# Patient Record
Sex: Female | Born: 1940
Health system: Southern US, Community
[De-identification: ages and names within clinical notes are randomized; demographics above are authoritative.]

## PROBLEM LIST (undated history)

## (undated) DIAGNOSIS — H269 Unspecified cataract: Secondary | ICD-10-CM

## (undated) DIAGNOSIS — J189 Pneumonia, unspecified organism: Secondary | ICD-10-CM

## (undated) DIAGNOSIS — M1990 Unspecified osteoarthritis, unspecified site: Secondary | ICD-10-CM

## (undated) DIAGNOSIS — T7840XA Allergy, unspecified, initial encounter: Secondary | ICD-10-CM

## (undated) DIAGNOSIS — M8580 Other specified disorders of bone density and structure, unspecified site: Secondary | ICD-10-CM

## (undated) DIAGNOSIS — C801 Malignant (primary) neoplasm, unspecified: Secondary | ICD-10-CM

## (undated) DIAGNOSIS — I82409 Acute embolism and thrombosis of unspecified deep veins of unspecified lower extremity: Secondary | ICD-10-CM

## (undated) DIAGNOSIS — Z9889 Other specified postprocedural states: Secondary | ICD-10-CM

## (undated) DIAGNOSIS — J45909 Unspecified asthma, uncomplicated: Secondary | ICD-10-CM

## (undated) DIAGNOSIS — R112 Nausea with vomiting, unspecified: Secondary | ICD-10-CM

## (undated) HISTORY — PX: CATARACT EXTRACTION, BILATERAL: SHX1313

## (undated) HISTORY — DX: Allergy, unspecified, initial encounter: T78.40XA

## (undated) HISTORY — PX: EYE SURGERY: SHX253

## (undated) HISTORY — DX: Unspecified osteoarthritis, unspecified site: M19.90

## (undated) HISTORY — PX: CHOLECYSTECTOMY: SHX55

## (undated) HISTORY — DX: Unspecified cataract: H26.9

## (undated) HISTORY — PX: OTHER SURGICAL HISTORY: SHX169

## (undated) HISTORY — DX: Other specified disorders of bone density and structure, unspecified site: M85.80

## (undated) HISTORY — PX: ABDOMINAL HYSTERECTOMY: SHX81

---

## 2000-12-07 ENCOUNTER — Other Ambulatory Visit: Admission: RE | Admit: 2000-12-07 | Discharge: 2000-12-07 | Payer: Self-pay | Admitting: Obstetrics and Gynecology

## 2001-12-08 ENCOUNTER — Other Ambulatory Visit: Admission: RE | Admit: 2001-12-08 | Discharge: 2001-12-08 | Payer: Self-pay | Admitting: Obstetrics and Gynecology

## 2002-12-22 HISTORY — PX: COLONOSCOPY: SHX174

## 2003-04-12 ENCOUNTER — Other Ambulatory Visit: Admission: RE | Admit: 2003-04-12 | Discharge: 2003-04-12 | Payer: Self-pay | Admitting: Obstetrics and Gynecology

## 2005-09-01 ENCOUNTER — Ambulatory Visit (HOSPITAL_COMMUNITY): Admission: RE | Admit: 2005-09-01 | Discharge: 2005-09-01 | Payer: Self-pay | Admitting: Specialist

## 2005-09-01 ENCOUNTER — Ambulatory Visit (HOSPITAL_BASED_OUTPATIENT_CLINIC_OR_DEPARTMENT_OTHER): Admission: RE | Admit: 2005-09-01 | Discharge: 2005-09-01 | Payer: Self-pay | Admitting: Specialist

## 2006-10-07 ENCOUNTER — Other Ambulatory Visit: Admission: RE | Admit: 2006-10-07 | Discharge: 2006-10-07 | Payer: Self-pay | Admitting: Obstetrics and Gynecology

## 2012-01-08 DIAGNOSIS — Z1231 Encounter for screening mammogram for malignant neoplasm of breast: Secondary | ICD-10-CM | POA: Diagnosis not present

## 2012-02-26 DIAGNOSIS — J069 Acute upper respiratory infection, unspecified: Secondary | ICD-10-CM | POA: Diagnosis not present

## 2012-04-12 DIAGNOSIS — Z01419 Encounter for gynecological examination (general) (routine) without abnormal findings: Secondary | ICD-10-CM | POA: Diagnosis not present

## 2012-04-12 DIAGNOSIS — Z124 Encounter for screening for malignant neoplasm of cervix: Secondary | ICD-10-CM | POA: Diagnosis not present

## 2012-04-12 DIAGNOSIS — Z0000 Encounter for general adult medical examination without abnormal findings: Secondary | ICD-10-CM | POA: Diagnosis not present

## 2012-04-13 DIAGNOSIS — Z961 Presence of intraocular lens: Secondary | ICD-10-CM | POA: Diagnosis not present

## 2012-04-13 DIAGNOSIS — H2510 Age-related nuclear cataract, unspecified eye: Secondary | ICD-10-CM | POA: Diagnosis not present

## 2012-04-26 DIAGNOSIS — M899 Disorder of bone, unspecified: Secondary | ICD-10-CM | POA: Diagnosis not present

## 2012-04-26 DIAGNOSIS — M949 Disorder of cartilage, unspecified: Secondary | ICD-10-CM | POA: Diagnosis not present

## 2012-05-03 DIAGNOSIS — N959 Unspecified menopausal and perimenopausal disorder: Secondary | ICD-10-CM | POA: Diagnosis not present

## 2012-08-12 DIAGNOSIS — R609 Edema, unspecified: Secondary | ICD-10-CM | POA: Diagnosis not present

## 2012-08-12 DIAGNOSIS — M2010 Hallux valgus (acquired), unspecified foot: Secondary | ICD-10-CM | POA: Diagnosis not present

## 2012-08-12 DIAGNOSIS — M2040 Other hammer toe(s) (acquired), unspecified foot: Secondary | ICD-10-CM | POA: Diagnosis not present

## 2012-09-07 DIAGNOSIS — L570 Actinic keratosis: Secondary | ICD-10-CM | POA: Diagnosis not present

## 2012-09-07 DIAGNOSIS — D1801 Hemangioma of skin and subcutaneous tissue: Secondary | ICD-10-CM | POA: Diagnosis not present

## 2012-10-05 DIAGNOSIS — M76899 Other specified enthesopathies of unspecified lower limb, excluding foot: Secondary | ICD-10-CM | POA: Diagnosis not present

## 2013-02-21 DIAGNOSIS — D234 Other benign neoplasm of skin of scalp and neck: Secondary | ICD-10-CM | POA: Diagnosis not present

## 2013-02-21 DIAGNOSIS — D1801 Hemangioma of skin and subcutaneous tissue: Secondary | ICD-10-CM | POA: Diagnosis not present

## 2013-02-21 DIAGNOSIS — D235 Other benign neoplasm of skin of trunk: Secondary | ICD-10-CM | POA: Diagnosis not present

## 2013-02-21 DIAGNOSIS — D485 Neoplasm of uncertain behavior of skin: Secondary | ICD-10-CM | POA: Diagnosis not present

## 2013-02-21 DIAGNOSIS — L570 Actinic keratosis: Secondary | ICD-10-CM | POA: Diagnosis not present

## 2013-02-21 DIAGNOSIS — D0470 Carcinoma in situ of skin of unspecified lower limb, including hip: Secondary | ICD-10-CM | POA: Diagnosis not present

## 2013-03-07 DIAGNOSIS — H0019 Chalazion unspecified eye, unspecified eyelid: Secondary | ICD-10-CM | POA: Diagnosis not present

## 2013-03-22 DIAGNOSIS — C4492 Squamous cell carcinoma of skin, unspecified: Secondary | ICD-10-CM | POA: Diagnosis not present

## 2013-04-18 DIAGNOSIS — M2040 Other hammer toe(s) (acquired), unspecified foot: Secondary | ICD-10-CM | POA: Diagnosis not present

## 2013-04-19 DIAGNOSIS — M25569 Pain in unspecified knee: Secondary | ICD-10-CM | POA: Diagnosis not present

## 2013-04-19 DIAGNOSIS — M1710 Unilateral primary osteoarthritis, unspecified knee: Secondary | ICD-10-CM | POA: Diagnosis not present

## 2013-08-10 ENCOUNTER — Encounter: Payer: Self-pay | Admitting: Internal Medicine

## 2013-10-12 DIAGNOSIS — L821 Other seborrheic keratosis: Secondary | ICD-10-CM | POA: Diagnosis not present

## 2013-10-12 DIAGNOSIS — D1801 Hemangioma of skin and subcutaneous tissue: Secondary | ICD-10-CM | POA: Diagnosis not present

## 2013-10-12 DIAGNOSIS — L259 Unspecified contact dermatitis, unspecified cause: Secondary | ICD-10-CM | POA: Diagnosis not present

## 2013-10-12 DIAGNOSIS — Z85828 Personal history of other malignant neoplasm of skin: Secondary | ICD-10-CM | POA: Diagnosis not present

## 2013-10-19 DIAGNOSIS — S8253XA Displaced fracture of medial malleolus of unspecified tibia, initial encounter for closed fracture: Secondary | ICD-10-CM | POA: Diagnosis not present

## 2013-10-25 DIAGNOSIS — Z0000 Encounter for general adult medical examination without abnormal findings: Secondary | ICD-10-CM | POA: Diagnosis not present

## 2013-10-25 DIAGNOSIS — R3915 Urgency of urination: Secondary | ICD-10-CM | POA: Diagnosis not present

## 2013-10-25 DIAGNOSIS — Z01419 Encounter for gynecological examination (general) (routine) without abnormal findings: Secondary | ICD-10-CM | POA: Diagnosis not present

## 2013-10-25 DIAGNOSIS — N951 Menopausal and female climacteric states: Secondary | ICD-10-CM | POA: Diagnosis not present

## 2013-11-11 DIAGNOSIS — S8253XA Displaced fracture of medial malleolus of unspecified tibia, initial encounter for closed fracture: Secondary | ICD-10-CM | POA: Diagnosis not present

## 2013-11-28 DIAGNOSIS — J069 Acute upper respiratory infection, unspecified: Secondary | ICD-10-CM | POA: Diagnosis not present

## 2013-11-28 DIAGNOSIS — J019 Acute sinusitis, unspecified: Secondary | ICD-10-CM | POA: Diagnosis not present

## 2013-12-07 DIAGNOSIS — M6240 Contracture of muscle, unspecified site: Secondary | ICD-10-CM | POA: Diagnosis not present

## 2013-12-07 DIAGNOSIS — M6289 Other specified disorders of muscle: Secondary | ICD-10-CM | POA: Diagnosis not present

## 2013-12-30 DIAGNOSIS — M6289 Other specified disorders of muscle: Secondary | ICD-10-CM | POA: Diagnosis not present

## 2014-01-03 DIAGNOSIS — M6289 Other specified disorders of muscle: Secondary | ICD-10-CM | POA: Diagnosis not present

## 2014-01-11 DIAGNOSIS — M6289 Other specified disorders of muscle: Secondary | ICD-10-CM | POA: Diagnosis not present

## 2014-01-26 ENCOUNTER — Other Ambulatory Visit: Payer: Self-pay

## 2014-01-26 DIAGNOSIS — D485 Neoplasm of uncertain behavior of skin: Secondary | ICD-10-CM | POA: Diagnosis not present

## 2014-01-26 DIAGNOSIS — L570 Actinic keratosis: Secondary | ICD-10-CM | POA: Diagnosis not present

## 2014-01-31 DIAGNOSIS — M6289 Other specified disorders of muscle: Secondary | ICD-10-CM | POA: Diagnosis not present

## 2014-03-03 DIAGNOSIS — M21619 Bunion of unspecified foot: Secondary | ICD-10-CM | POA: Diagnosis not present

## 2014-03-03 DIAGNOSIS — M7750 Other enthesopathy of unspecified foot: Secondary | ICD-10-CM | POA: Diagnosis not present

## 2014-03-03 DIAGNOSIS — M2010 Hallux valgus (acquired), unspecified foot: Secondary | ICD-10-CM | POA: Diagnosis not present

## 2014-04-17 DIAGNOSIS — M25579 Pain in unspecified ankle and joints of unspecified foot: Secondary | ICD-10-CM | POA: Diagnosis not present

## 2014-04-17 DIAGNOSIS — M2140 Flat foot [pes planus] (acquired), unspecified foot: Secondary | ICD-10-CM | POA: Diagnosis not present

## 2014-04-25 DIAGNOSIS — M25579 Pain in unspecified ankle and joints of unspecified foot: Secondary | ICD-10-CM | POA: Diagnosis not present

## 2014-04-25 DIAGNOSIS — M2140 Flat foot [pes planus] (acquired), unspecified foot: Secondary | ICD-10-CM | POA: Diagnosis not present

## 2014-04-26 ENCOUNTER — Other Ambulatory Visit: Payer: Self-pay | Admitting: Dermatology

## 2014-04-26 DIAGNOSIS — D1801 Hemangioma of skin and subcutaneous tissue: Secondary | ICD-10-CM | POA: Diagnosis not present

## 2014-04-26 DIAGNOSIS — L820 Inflamed seborrheic keratosis: Secondary | ICD-10-CM | POA: Diagnosis not present

## 2014-04-26 DIAGNOSIS — L570 Actinic keratosis: Secondary | ICD-10-CM | POA: Diagnosis not present

## 2014-04-26 DIAGNOSIS — L821 Other seborrheic keratosis: Secondary | ICD-10-CM | POA: Diagnosis not present

## 2014-04-26 DIAGNOSIS — L578 Other skin changes due to chronic exposure to nonionizing radiation: Secondary | ICD-10-CM | POA: Diagnosis not present

## 2014-04-26 DIAGNOSIS — L439 Lichen planus, unspecified: Secondary | ICD-10-CM | POA: Diagnosis not present

## 2014-04-26 DIAGNOSIS — D485 Neoplasm of uncertain behavior of skin: Secondary | ICD-10-CM | POA: Diagnosis not present

## 2014-05-02 DIAGNOSIS — M25569 Pain in unspecified knee: Secondary | ICD-10-CM | POA: Diagnosis not present

## 2014-05-05 DIAGNOSIS — M25469 Effusion, unspecified knee: Secondary | ICD-10-CM | POA: Diagnosis not present

## 2014-05-18 DIAGNOSIS — M1710 Unilateral primary osteoarthritis, unspecified knee: Secondary | ICD-10-CM | POA: Diagnosis not present

## 2014-05-18 DIAGNOSIS — M25569 Pain in unspecified knee: Secondary | ICD-10-CM | POA: Diagnosis not present

## 2014-05-29 DIAGNOSIS — M25569 Pain in unspecified knee: Secondary | ICD-10-CM | POA: Diagnosis not present

## 2014-06-07 DIAGNOSIS — M1710 Unilateral primary osteoarthritis, unspecified knee: Secondary | ICD-10-CM | POA: Diagnosis not present

## 2014-06-13 DIAGNOSIS — M1710 Unilateral primary osteoarthritis, unspecified knee: Secondary | ICD-10-CM | POA: Diagnosis not present

## 2014-06-13 DIAGNOSIS — M25569 Pain in unspecified knee: Secondary | ICD-10-CM | POA: Diagnosis not present

## 2014-06-19 DIAGNOSIS — M1710 Unilateral primary osteoarthritis, unspecified knee: Secondary | ICD-10-CM | POA: Diagnosis not present

## 2014-06-19 DIAGNOSIS — M25569 Pain in unspecified knee: Secondary | ICD-10-CM | POA: Diagnosis not present

## 2014-06-27 DIAGNOSIS — R29898 Other symptoms and signs involving the musculoskeletal system: Secondary | ICD-10-CM | POA: Diagnosis not present

## 2014-06-27 DIAGNOSIS — M25569 Pain in unspecified knee: Secondary | ICD-10-CM | POA: Diagnosis not present

## 2014-06-27 DIAGNOSIS — M79609 Pain in unspecified limb: Secondary | ICD-10-CM | POA: Diagnosis not present

## 2014-06-28 DIAGNOSIS — M25569 Pain in unspecified knee: Secondary | ICD-10-CM | POA: Diagnosis not present

## 2014-06-28 DIAGNOSIS — R29898 Other symptoms and signs involving the musculoskeletal system: Secondary | ICD-10-CM | POA: Diagnosis not present

## 2014-06-28 DIAGNOSIS — M79609 Pain in unspecified limb: Secondary | ICD-10-CM | POA: Diagnosis not present

## 2014-06-30 DIAGNOSIS — M25569 Pain in unspecified knee: Secondary | ICD-10-CM | POA: Diagnosis not present

## 2014-06-30 DIAGNOSIS — R29898 Other symptoms and signs involving the musculoskeletal system: Secondary | ICD-10-CM | POA: Diagnosis not present

## 2014-06-30 DIAGNOSIS — M79609 Pain in unspecified limb: Secondary | ICD-10-CM | POA: Diagnosis not present

## 2014-07-03 DIAGNOSIS — M79609 Pain in unspecified limb: Secondary | ICD-10-CM | POA: Diagnosis not present

## 2014-07-03 DIAGNOSIS — M25569 Pain in unspecified knee: Secondary | ICD-10-CM | POA: Diagnosis not present

## 2014-07-03 DIAGNOSIS — R29898 Other symptoms and signs involving the musculoskeletal system: Secondary | ICD-10-CM | POA: Diagnosis not present

## 2014-07-06 DIAGNOSIS — M25569 Pain in unspecified knee: Secondary | ICD-10-CM | POA: Diagnosis not present

## 2014-07-06 DIAGNOSIS — M79609 Pain in unspecified limb: Secondary | ICD-10-CM | POA: Diagnosis not present

## 2014-07-06 DIAGNOSIS — R29898 Other symptoms and signs involving the musculoskeletal system: Secondary | ICD-10-CM | POA: Diagnosis not present

## 2014-07-13 DIAGNOSIS — M25569 Pain in unspecified knee: Secondary | ICD-10-CM | POA: Diagnosis not present

## 2014-07-13 DIAGNOSIS — M79609 Pain in unspecified limb: Secondary | ICD-10-CM | POA: Diagnosis not present

## 2014-07-13 DIAGNOSIS — R29898 Other symptoms and signs involving the musculoskeletal system: Secondary | ICD-10-CM | POA: Diagnosis not present

## 2014-07-18 DIAGNOSIS — R29898 Other symptoms and signs involving the musculoskeletal system: Secondary | ICD-10-CM | POA: Diagnosis not present

## 2014-07-18 DIAGNOSIS — M79609 Pain in unspecified limb: Secondary | ICD-10-CM | POA: Diagnosis not present

## 2014-07-18 DIAGNOSIS — M25569 Pain in unspecified knee: Secondary | ICD-10-CM | POA: Diagnosis not present

## 2014-07-20 DIAGNOSIS — M25569 Pain in unspecified knee: Secondary | ICD-10-CM | POA: Diagnosis not present

## 2014-07-20 DIAGNOSIS — M79609 Pain in unspecified limb: Secondary | ICD-10-CM | POA: Diagnosis not present

## 2014-07-20 DIAGNOSIS — R29898 Other symptoms and signs involving the musculoskeletal system: Secondary | ICD-10-CM | POA: Diagnosis not present

## 2014-07-21 DIAGNOSIS — M79609 Pain in unspecified limb: Secondary | ICD-10-CM | POA: Diagnosis not present

## 2014-07-21 DIAGNOSIS — R29898 Other symptoms and signs involving the musculoskeletal system: Secondary | ICD-10-CM | POA: Diagnosis not present

## 2014-07-21 DIAGNOSIS — M25569 Pain in unspecified knee: Secondary | ICD-10-CM | POA: Diagnosis not present

## 2014-07-28 DIAGNOSIS — M1710 Unilateral primary osteoarthritis, unspecified knee: Secondary | ICD-10-CM | POA: Diagnosis not present

## 2014-07-28 DIAGNOSIS — S83289A Other tear of lateral meniscus, current injury, unspecified knee, initial encounter: Secondary | ICD-10-CM | POA: Diagnosis not present

## 2014-08-09 NOTE — Progress Notes (Signed)
Surgery 08-16-14, pre op 08-15-14, please put orders in Epic Thanks

## 2014-08-10 ENCOUNTER — Other Ambulatory Visit: Payer: Self-pay | Admitting: Surgical

## 2014-08-10 NOTE — Progress Notes (Signed)
Surgery 08-16-14, pre op 08-15-14, please put orders in Epic Thanks

## 2014-08-11 ENCOUNTER — Encounter (HOSPITAL_COMMUNITY): Payer: Self-pay | Admitting: Pharmacy Technician

## 2014-08-14 NOTE — Patient Instructions (Signed)
Cassandra Underwood  08/14/2014   Your procedure is scheduled on:  08/16/2014    Report to [facility].  Follow the Signs to [place] at  1000      am  Call this number if you have problems the morning of surgery: [phone]   Remember:   Do not eat food or drink liquids after midnight.   Take these medicines the morning of surgery with A SIP OF WATER:    Do not wear jewelry, make-up or nail polish.  Do not wear lotions, powders, or perfumes. , deodorant    Do not shave 48 hours prior to surgery.   Do not bring valuables to the hospital.  Contacts, dentures or bridgework may not be worn into surgery.    For patients admitted to the hospital, checkout time is 11:00 AM the day of  discharge.   Patients discharged the day of surgery will not be allowed to drive  home.  Name and phone number of your driver:    Cassandra Underwood  08/14/2014   Your procedure is scheduled on:    Report to [facility].  Follow the Signs to [place] at        am  Call this number if you have problems the morning of surgery: [phone]   Remember:   Do not eat food or drink liquids after midnight.   Take these medicines the morning of surgery with A SIP OF WATER:    Do not wear jewelry, make-up or nail polish.  Do not wear lotions, powders, or perfumes. You may wear deodorant.  Do not shave 48 hours prior to surgery. Men may shave face and neck.  Do not bring valuables to the hospital.  Contacts, dentures or bridgework may not be worn into surgery.  Leave suitcase in the car. After surgery it may be brought to your room.  For patients admitted to the hospital, checkout time is 11:00 AM the day of  discharge.   Patients discharged the day of surgery will not be allowed to drive  home.  Name and phone number of your driver:   SEE CHG INSTRUCTION SHEET    Please read over the following fact sheets that you were given: MRSA Information, coughing and deep breathing  exercises, leg exercises            [facility] - Preparing for Surgery Before surgery, you can play an important role.  Because skin is not sterile, your skin needs to be as free of germs as possible.  You can reduce the number of germs on your skin by washing with CHG (chlorahexidine gluconate) soap before surgery.  CHG is an antiseptic cleaner which kills germs and bonds with the skin to continue killing germs even after washing. Please DO NOT use if you have an allergy to CHG or antibacterial soaps.  If your skin becomes reddened/irritated stop using the CHG and inform your nurse when you arrive at Short Stay. Do not shave (including legs and underarms) for at least 48 hours prior to the first CHG shower.  You may shave your face/neck. Please follow these instructions carefully:  1.  Shower with CHG Soap the night before surgery and the  morning of Surgery.  2.  If you choose to wash your hair, wash your hair first as usual with your  normal  shampoo.  3.  After you shampoo, rinse your hair and body thoroughly to remove  the  shampoo.                           4.  Use CHG as you would any other liquid soap.  You can apply chg directly  to the skin and wash                       Gently with a scrungie or clean washcloth.  5.  Apply the CHG Soap to your body ONLY FROM THE NECK DOWN.   Do not use on face/ open                           Wound or open sores. Avoid contact with eyes, ears mouth and genitals (private parts).                       Wash face,  Genitals (private parts) with your normal soap.             6.  Wash thoroughly, paying special attention to the area where your surgery  will be performed.  7.  Thoroughly rinse your body with warm water from the neck down.  8.  DO NOT shower/wash with your normal soap after using and rinsing off  the CHG Soap.                9.  Pat yourself dry with a clean towel.            10.  Wear clean pajamas.            11.  Place clean sheets on your  bed the night of your first shower and do not  sleep with pets. Day of Surgery : Do not apply any lotions/deodorants the morning of surgery.  Please wear clean clothes to the hospital/surgery center.  FAILURE TO FOLLOW THESE INSTRUCTIONS MAY RESULT IN THE CANCELLATION OF YOUR SURGERY PATIENT SIGNATURE_________________________________  NURSE SIGNATURE__________________________________  ________________________________________________________________________     Please read over the following fact sheets that you were given: , coughing and deep breathing exercises, leg exercises

## 2014-08-15 ENCOUNTER — Encounter (HOSPITAL_COMMUNITY)
Admission: RE | Admit: 2014-08-15 | Discharge: 2014-08-15 | Disposition: A | Discharge status: Home / Self Care | Payer: Medicare Other | Source: Ambulatory Visit | Attending: Orthopedic Surgery | Admitting: Orthopedic Surgery

## 2014-08-15 ENCOUNTER — Encounter (INDEPENDENT_AMBULATORY_CARE_PROVIDER_SITE_OTHER): Payer: Self-pay

## 2014-08-15 ENCOUNTER — Encounter (HOSPITAL_COMMUNITY): Payer: Self-pay

## 2014-08-15 DIAGNOSIS — J45909 Unspecified asthma, uncomplicated: Secondary | ICD-10-CM | POA: Diagnosis not present

## 2014-08-15 DIAGNOSIS — Y929 Unspecified place or not applicable: Secondary | ICD-10-CM | POA: Diagnosis not present

## 2014-08-15 DIAGNOSIS — S83289A Other tear of lateral meniscus, current injury, unspecified knee, initial encounter: Secondary | ICD-10-CM | POA: Diagnosis not present

## 2014-08-15 DIAGNOSIS — X58XXXA Exposure to other specified factors, initial encounter: Secondary | ICD-10-CM | POA: Diagnosis not present

## 2014-08-15 HISTORY — DX: Other specified postprocedural states: R11.2

## 2014-08-15 HISTORY — DX: Other specified postprocedural states: Z98.890

## 2014-08-15 HISTORY — DX: Unspecified asthma, uncomplicated: J45.909

## 2014-08-15 LAB — CBC
HEMATOCRIT: 42.9 % (ref 36.0–46.0)
HEMOGLOBIN: 14 g/dL (ref 12.0–15.0)
MCH: 29 pg (ref 26.0–34.0)
MCHC: 32.6 g/dL (ref 30.0–36.0)
MCV: 88.8 fL (ref 78.0–100.0)
Platelets: 272 10*3/uL (ref 150–400)
RBC: 4.83 MIL/uL (ref 3.87–5.11)
RDW: 12.8 % (ref 11.5–15.5)
WBC: 5.6 10*3/uL (ref 4.0–10.5)

## 2014-08-16 ENCOUNTER — Encounter (HOSPITAL_COMMUNITY): Payer: Self-pay | Admitting: *Deleted

## 2014-08-16 ENCOUNTER — Ambulatory Visit (HOSPITAL_COMMUNITY)
Admission: RE | Admit: 2014-08-16 | Discharge: 2014-08-16 | Disposition: A | Discharge status: Home / Self Care | Payer: Medicare Other | Source: Ambulatory Visit | Attending: Orthopedic Surgery | Admitting: Orthopedic Surgery

## 2014-08-16 ENCOUNTER — Encounter (HOSPITAL_COMMUNITY)
Admission: RE | Disposition: A | Discharge status: Home / Self Care | Payer: Self-pay | Source: Ambulatory Visit | Attending: Orthopedic Surgery

## 2014-08-16 ENCOUNTER — Ambulatory Visit (HOSPITAL_COMMUNITY): Payer: Medicare Other | Admitting: Anesthesiology

## 2014-08-16 ENCOUNTER — Encounter (HOSPITAL_COMMUNITY): Payer: Medicare Other | Admitting: Anesthesiology

## 2014-08-16 DIAGNOSIS — S83289A Other tear of lateral meniscus, current injury, unspecified knee, initial encounter: Secondary | ICD-10-CM | POA: Diagnosis not present

## 2014-08-16 DIAGNOSIS — J45909 Unspecified asthma, uncomplicated: Secondary | ICD-10-CM | POA: Diagnosis not present

## 2014-08-16 DIAGNOSIS — M23302 Other meniscus derangements, unspecified lateral meniscus, unspecified knee: Secondary | ICD-10-CM | POA: Diagnosis not present

## 2014-08-16 DIAGNOSIS — S83281D Other tear of lateral meniscus, current injury, right knee, subsequent encounter: Secondary | ICD-10-CM

## 2014-08-16 DIAGNOSIS — X58XXXA Exposure to other specified factors, initial encounter: Secondary | ICD-10-CM | POA: Insufficient documentation

## 2014-08-16 DIAGNOSIS — M23305 Other meniscus derangements, unspecified medial meniscus, unspecified knee: Secondary | ICD-10-CM | POA: Diagnosis not present

## 2014-08-16 DIAGNOSIS — Y929 Unspecified place or not applicable: Secondary | ICD-10-CM | POA: Insufficient documentation

## 2014-08-16 HISTORY — PX: KNEE ARTHROSCOPY: SHX127

## 2014-08-16 SURGERY — ARTHROSCOPY, KNEE
Anesthesia: General | Site: Knee | Laterality: Right

## 2014-08-16 MED ORDER — BUPIVACAINE-EPINEPHRINE 0.25% -1:200000 IJ SOLN
INTRAMUSCULAR | Status: DC | PRN
  Administered 2014-08-16: 20 mL

## 2014-08-16 MED ORDER — CHLORHEXIDINE GLUCONATE 4 % EX LIQD: 60.0000 mL | Freq: Once | CUTANEOUS | Status: DC

## 2014-08-16 MED ORDER — OXYCODONE HCL 5 MG/5ML PO SOLN: 5.0000 mg | Freq: Once | ORAL | Status: AC | PRN

## 2014-08-16 MED ORDER — HYDROMORPHONE HCL PF 1 MG/ML IJ SOLN
0.2500 mg | INTRAMUSCULAR | Status: DC | PRN
  Administered 2014-08-16: 0.5 mg via INTRAVENOUS

## 2014-08-16 MED ORDER — BUPIVACAINE-EPINEPHRINE (PF) 0.25% -1:200000 IJ SOLN
INTRAMUSCULAR | Status: AC
  Filled 2014-08-16: qty 30

## 2014-08-16 MED ORDER — MEPERIDINE HCL 50 MG/ML IJ SOLN: 6.2500 mg | INTRAMUSCULAR | Status: DC | PRN

## 2014-08-16 MED ORDER — SCOPOLAMINE 1 MG/3DAYS TD PT72
1.0000 | MEDICATED_PATCH | TRANSDERMAL | Status: DC
  Administered 2014-08-16: 1.5 mg via TRANSDERMAL
  Filled 2014-08-16: qty 1

## 2014-08-16 MED ORDER — SCOPOLAMINE 1 MG/3DAYS TD PT72
MEDICATED_PATCH | TRANSDERMAL | Status: AC
  Filled 2014-08-16: qty 1

## 2014-08-16 MED ORDER — HYDROCODONE-ACETAMINOPHEN 5-325 MG PO TABS: 1.0000 | ORAL_TABLET | Freq: Four times a day (QID) | ORAL | Status: DC | PRN

## 2014-08-16 MED ORDER — LIDOCAINE HCL (CARDIAC) 20 MG/ML IV SOLN
INTRAVENOUS | Status: AC
  Filled 2014-08-16: qty 5

## 2014-08-16 MED ORDER — PROPOFOL 10 MG/ML IV BOLUS
INTRAVENOUS | Status: DC | PRN
  Administered 2014-08-16: 200 mg via INTRAVENOUS

## 2014-08-16 MED ORDER — SODIUM CHLORIDE 0.9 % IR SOLN
Status: DC | PRN
  Administered 2014-08-16: 9000 mL

## 2014-08-16 MED ORDER — MIDAZOLAM HCL 5 MG/5ML IJ SOLN
INTRAMUSCULAR | Status: DC | PRN
  Administered 2014-08-16: 2 mg via INTRAVENOUS

## 2014-08-16 MED ORDER — PROMETHAZINE HCL 25 MG/ML IJ SOLN: 6.2500 mg | INTRAMUSCULAR | Status: DC | PRN

## 2014-08-16 MED ORDER — DEXAMETHASONE SODIUM PHOSPHATE 10 MG/ML IJ SOLN
INTRAMUSCULAR | Status: DC | PRN
  Administered 2014-08-16: 10 mg via INTRAVENOUS

## 2014-08-16 MED ORDER — HYDROMORPHONE HCL PF 1 MG/ML IJ SOLN
INTRAMUSCULAR | Status: AC
  Filled 2014-08-16: qty 1

## 2014-08-16 MED ORDER — LIDOCAINE HCL (CARDIAC) 20 MG/ML IV SOLN
INTRAVENOUS | Status: DC | PRN
  Administered 2014-08-16: 100 mg via INTRAVENOUS

## 2014-08-16 MED ORDER — METHOCARBAMOL 500 MG PO TABS: 500.0000 mg | ORAL_TABLET | Freq: Four times a day (QID) | ORAL | Status: DC

## 2014-08-16 MED ORDER — MIDAZOLAM HCL 2 MG/2ML IJ SOLN
INTRAMUSCULAR | Status: AC
  Filled 2014-08-16: qty 2

## 2014-08-16 MED ORDER — FENTANYL CITRATE 0.05 MG/ML IJ SOLN
INTRAMUSCULAR | Status: DC | PRN
  Administered 2014-08-16 (×2): 50 ug via INTRAVENOUS

## 2014-08-16 MED ORDER — LACTATED RINGERS IV SOLN
INTRAVENOUS | Status: DC
  Administered 2014-08-16: 1000 mL via INTRAVENOUS

## 2014-08-16 MED ORDER — CEFAZOLIN SODIUM-DEXTROSE 2-3 GM-% IV SOLR
INTRAVENOUS | Status: AC
  Filled 2014-08-16: qty 50

## 2014-08-16 MED ORDER — PROPOFOL 10 MG/ML IV BOLUS
INTRAVENOUS | Status: AC
  Filled 2014-08-16: qty 20

## 2014-08-16 MED ORDER — SODIUM CHLORIDE 0.9 % IV SOLN: INTRAVENOUS | Status: DC

## 2014-08-16 MED ORDER — OXYCODONE HCL 5 MG PO TABS
5.0000 mg | ORAL_TABLET | Freq: Once | ORAL | Status: AC | PRN
  Administered 2014-08-16: 5 mg via ORAL
  Filled 2014-08-16: qty 1

## 2014-08-16 MED ORDER — FENTANYL CITRATE 0.05 MG/ML IJ SOLN
INTRAMUSCULAR | Status: AC
  Filled 2014-08-16: qty 2

## 2014-08-16 MED ORDER — CEFAZOLIN SODIUM-DEXTROSE 2-3 GM-% IV SOLR
2.0000 g | INTRAVENOUS | Status: AC
  Administered 2014-08-16: 2 g via INTRAVENOUS

## 2014-08-16 MED ORDER — ONDANSETRON HCL 4 MG/2ML IJ SOLN
INTRAMUSCULAR | Status: DC | PRN
  Administered 2014-08-16: 4 mg via INTRAVENOUS

## 2014-08-16 SURGICAL SUPPLY — 31 items
BANDAGE ELASTIC 6 VELCRO ST LF (GAUZE/BANDAGES/DRESSINGS) ×2 IMPLANT
BLADE 4.2CUDA (BLADE) ×3 IMPLANT
CHLORAPREP W/TINT 26ML (MISCELLANEOUS) ×2 IMPLANT
CLOTH BEACON ORANGE TIMEOUT ST (SAFETY) ×3 IMPLANT
COUNTER NEEDLE 20 DBL MAG RED (NEEDLE) ×3 IMPLANT
CUFF TOURN SGL QUICK 34 (TOURNIQUET CUFF) ×3
CUFF TRNQT CYL 34X4X40X1 (TOURNIQUET CUFF) ×1 IMPLANT
DRAPE U-SHAPE 47X51 STRL (DRAPES) ×3 IMPLANT
DRSG EMULSION OIL 3X3 NADH (GAUZE/BANDAGES/DRESSINGS) ×3 IMPLANT
DURAPREP 26ML APPLICATOR (WOUND CARE) ×1 IMPLANT
GAUZE SPONGE 4X4 16PLY XRAY LF (GAUZE/BANDAGES/DRESSINGS) ×2 IMPLANT
GLOVE BIO SURGEON STRL SZ 6.5 (GLOVE) ×1 IMPLANT
GLOVE BIO SURGEON STRL SZ8 (GLOVE) ×3 IMPLANT
GLOVE BIO SURGEONS STRL SZ 6.5 (GLOVE) ×1
GLOVE BIOGEL PI IND STRL 6.5 (GLOVE) IMPLANT
GLOVE BIOGEL PI IND STRL 8 (GLOVE) ×1 IMPLANT
GLOVE BIOGEL PI INDICATOR 6.5 (GLOVE) ×2
GLOVE BIOGEL PI INDICATOR 8 (GLOVE) ×2
GLOVE SURG SS PI 6.5 STRL IVOR (GLOVE) ×2 IMPLANT
GOWN STRL REUS W/TWL LRG LVL3 (GOWN DISPOSABLE) ×5 IMPLANT
MANIFOLD NEPTUNE II (INSTRUMENTS) ×3 IMPLANT
PACK ARTHROSCOPY WL (CUSTOM PROCEDURE TRAY) ×3 IMPLANT
PACK ICE MAXI GEL EZY WRAP (MISCELLANEOUS) ×9 IMPLANT
PADDING CAST COTTON 6X4 STRL (CAST SUPPLIES) ×4 IMPLANT
POSITIONER SURGICAL ARM (MISCELLANEOUS) ×3 IMPLANT
SET ARTHROSCOPY TUBING (MISCELLANEOUS) ×3
SET ARTHROSCOPY TUBING LN (MISCELLANEOUS) ×1 IMPLANT
SUT ETHILON 4 0 PS 2 18 (SUTURE) ×3 IMPLANT
TOWEL OR 17X26 10 PK STRL BLUE (TOWEL DISPOSABLE) ×3 IMPLANT
WAND 90 DEG TURBOVAC W/CORD (SURGICAL WAND) ×2 IMPLANT
WRAP KNEE MAXI GEL POST OP (GAUZE/BANDAGES/DRESSINGS) ×3 IMPLANT

## 2014-08-16 NOTE — Anesthesia Postprocedure Evaluation (Signed)
Anesthesia Post Note  Patient: Cassandra Underwood  Procedure(s) Performed: Procedure(s) (LRB): RIGHT ARTHROSCOPY WITH LATERAL KNEE MENISCUS DEBRIDEMENT AND CHONDROPLASTY (Right)  Anesthesia type: General  Patient location: PACU  Post pain: Pain level controlled  Post assessment: Post-op Vital signs reviewed  Last Vitals: BP 143/80  Pulse 72  Temp(Src) 36.2 C (Oral)  Resp 13  Ht 5\' 4"  (1.626 m)  Wt 144 lb (65.318 kg)  BMI 24.71 kg/m2  SpO2 94%  Post vital signs: Reviewed  Level of consciousness: sedated  Complications: No apparent anesthesia complications

## 2014-08-16 NOTE — H&P (Signed)
  CC- Cassandra Underwood is a 73 y.o. female who presents with right knee pain.  HPI- . Knee Pain: Patient presents with knee pain involving the  right knee. Onset of the symptoms was several months ago. Inciting event: none known. Current symptoms include giving out, pain located laterally and stiffness. Pain is aggravated by lateral movements, pivoting, rising after sitting and walking.  Patient has had no prior knee problems. Evaluation to date: MRI: abnormal lateral meniscal tear. Treatment to date: corticosteroid injection which was not very effective and viscosupplement injections which were also ineffective.  Past Medical History  Diagnosis Date  . PONV (postoperative nausea and vomiting)   . Asthma     hx of uses no medications    Past Surgical History  Procedure Laterality Date  . Cholecystectomy    . Abdominal hysterectomy    . Eye surgery      cataract per left eye   . Tummy tuck    . Bilat foot surgery       Prior to Admission medications   Medication Sig Start Date End Date Taking? Authorizing Provider  ibuprofen (ADVIL,MOTRIN) 200 MG tablet Take 400 mg by mouth every 6 (six) hours as needed (Pain).    Historical Provider, MD   KNEE EXAM antalgic gait, soft tissue tenderness over lateral joint line, no effusion, negative drawer sign, collateral ligaments intact  Physical Examination: General appearance - alert, well appearing, and in no distress Mental status - alert, oriented to person, place, and time Chest - clear to auscultation, no wheezes, rales or rhonchi, symmetric air entry Heart - normal rate, regular rhythm, normal S1, S2, no murmurs, rubs, clicks or gallops Abdomen - soft, nontender, nondistended, no masses or organomegaly Neurological - alert, oriented, normal speech, no focal findings or movement disorder noted   Asessment/Plan--- Right knee lateral meniscal tear- - Plan right knee arthroscopy with meniscal debridement. Procedure risks and potential comps  discussed with patient who elects to proceed. Goals are decreased pain and increased function with a high likelihood of achieving both

## 2014-08-16 NOTE — Interval H&P Note (Signed)
History and Physical Interval Note:  08/16/2014 12:08 PM  Cassandra Underwood  has presented today for surgery, with the diagnosis of RIGHT KNEE LATERAL MENISCUS TEAR  The various methods of treatment have been discussed with the patient and family. After consideration of risks, benefits and other options for treatment, the patient has consented to  Procedure(s): RIGHT ARTHROSCOPY KNEE (Right) as a surgical intervention .  The patient's history has been reviewed, patient examined, no change in status, stable for surgery.  I have reviewed the patient's chart and labs.  Questions were answered to the patient's satisfaction.     Gearlean Alf

## 2014-08-16 NOTE — Anesthesia Preprocedure Evaluation (Signed)
Anesthesia Evaluation  Patient identified by MRN, date of birth, ID band Patient awake    Reviewed: Allergy & Precautions, H&P , NPO status , Patient's Chart, lab work & pertinent test results  History of Anesthesia Complications (+) PONV and history of anesthetic complications  Airway Mallampati: II TM Distance: >3 FB Neck ROM: Full    Dental no notable dental hx.    Pulmonary asthma ,  breath sounds clear to auscultation  Pulmonary exam normal       Cardiovascular negative cardio ROS  Rhythm:Regular Rate:Normal     Neuro/Psych negative neurological ROS  negative psych ROS   GI/Hepatic negative GI ROS, Neg liver ROS,   Endo/Other  negative endocrine ROS  Renal/GU negative Renal ROS     Musculoskeletal negative musculoskeletal ROS (+)   Abdominal   Peds  Hematology negative hematology ROS (+)   Anesthesia Other Findings   Reproductive/Obstetrics negative OB ROS                           Anesthesia Physical Anesthesia Plan  ASA: II  Anesthesia Plan: General   Post-op Pain Management:    Induction: Intravenous  Airway Management Planned: LMA  Additional Equipment:   Intra-op Plan:   Post-operative Plan: Extubation in OR  Informed Consent: I have reviewed the patients History and Physical, chart, labs and discussed the procedure including the risks, benefits and alternatives for the proposed anesthesia with the patient or authorized representative who has indicated his/her understanding and acceptance.   Dental advisory given  Plan Discussed with: CRNA  Anesthesia Plan Comments:         Anesthesia Quick Evaluation

## 2014-08-16 NOTE — Transfer of Care (Signed)
Immediate Anesthesia Transfer of Care Note  Patient: Cassandra Underwood  Procedure(s) Performed: Procedure(s): RIGHT ARTHROSCOPY WITH LATERAL KNEE MENISCUS DEBRIDEMENT AND CHONDROPLASTY (Right)  Patient Location: PACU  Anesthesia Type:General  Level of Consciousness: sedated  Airway & Oxygen Therapy: Patient Spontanous Breathing and Patient connected to face mask oxygen  Post-op Assessment: Report given to PACU RN and Post -op Vital signs reviewed and stable  Post vital signs: Reviewed and stable  Complications: No apparent anesthesia complications

## 2014-08-16 NOTE — Op Note (Signed)
Preoperative diagnosis-  Right knee lateral meniscal tear  Postoperative diagnosis Right- knee lateral meniscal tear plus  Medial femoral chondral defect  Procedure- Right knee arthroscopy with lateral  meniscal debridement and chondroplasty   Surgeon- Dione Plover. Gertrue Willette, MD  Anesthesia-General  EBL-  Minimal  Complications- None  Condition- PACU - hemodynamically stable.  Brief clinical note- -Cassandra Underwood is a 73 y.o.  female with a several month history of right knee pain and mechanical symptoms. Exam and history suggested lateral meniscal tear confirmed by MRI. The patient presents now for arthroscopy and debridement  Procedure in detail -       After successful administration of General anesthetic, a tourmiquet is placed high on the Right  thigh and the Right lower extremity is prepped and draped in the usual sterile fashion. Time out is performed by the surgical team. Standard superomedial and inferolateral portal sites are marked and incisions made with an 11 blade. The inflow cannula is passed through the superomedial portal and camera through the inferolateral portal and inflow is initiated. Arthroscopic visualization proceeds.      The undersurface of the patella and trochlea are visualized and there are grade III changes on both surfaces. The medial and lateral gutters are visualized and there are  no loose bodies. Flexion and valgus force is applied to the knee and the medial compartment is entered. A spinal needle is passed into the joint through the site marked for the inferomedial portal. A small incision is made and the dilator passed into the joint. The findings for the medial compartment are small chondral defect 1 x 1 cm medial femoral condyle with normal meniscus. The shaver is used to debride the unstable cartilage to a stable bony,  base with stable edges. It is probed and found to be stable.    The intercondylar notch is visualized and the ACL appears normal. The lateral  compartment is entered and the findings are unstable tear body and posterior horn of lateral meniscus . The tear is debrided to a stable base with baskets and a shaver and sealed off with the Arthrocare.  It is probed and found to be stable. There is mild chondromalacia but no chondral defects.     The joint is again inspected and there are no other tears, defects or loose bodies identified. The arthroscopic equipment is then removed from the inferior portals which are closed with interrupted 4-0 nylon. 20 ml of .25% Marcaine with epinephrine are injected through the inflow cannula and the cannula is then removed and the portal closed with nylon. The incisions are cleaned and dried and a bulky sterile dressing is applied. The patient is then awakened and transported to recovery in stable condition.   08/16/2014, 1:09 PM

## 2014-08-16 NOTE — Progress Notes (Signed)
Patient has been up to BR  About 15 min ago w walker and RN assistance; tolerated well. Just became a little nauseated after up moving around getting dressed. Cool cloth to forehead. Patient wants to go home

## 2014-08-16 NOTE — Discharge Instructions (Signed)

## 2014-08-17 ENCOUNTER — Encounter (HOSPITAL_COMMUNITY): Payer: Self-pay | Admitting: Orthopedic Surgery

## 2014-09-27 DIAGNOSIS — S83281D Other tear of lateral meniscus, current injury, right knee, subsequent encounter: Secondary | ICD-10-CM | POA: Diagnosis not present

## 2014-10-02 DIAGNOSIS — S83281D Other tear of lateral meniscus, current injury, right knee, subsequent encounter: Secondary | ICD-10-CM | POA: Diagnosis not present

## 2014-10-04 DIAGNOSIS — S83281D Other tear of lateral meniscus, current injury, right knee, subsequent encounter: Secondary | ICD-10-CM | POA: Diagnosis not present

## 2014-10-09 DIAGNOSIS — S83281D Other tear of lateral meniscus, current injury, right knee, subsequent encounter: Secondary | ICD-10-CM | POA: Diagnosis not present

## 2014-10-10 DIAGNOSIS — M25561 Pain in right knee: Secondary | ICD-10-CM | POA: Diagnosis not present

## 2014-10-17 DIAGNOSIS — M25561 Pain in right knee: Secondary | ICD-10-CM | POA: Diagnosis not present

## 2014-10-18 DIAGNOSIS — S83281D Other tear of lateral meniscus, current injury, right knee, subsequent encounter: Secondary | ICD-10-CM | POA: Diagnosis not present

## 2014-10-23 DIAGNOSIS — S83281D Other tear of lateral meniscus, current injury, right knee, subsequent encounter: Secondary | ICD-10-CM | POA: Diagnosis not present

## 2014-10-24 ENCOUNTER — Other Ambulatory Visit: Payer: Self-pay | Admitting: Dermatology

## 2014-10-24 DIAGNOSIS — H02839 Dermatochalasis of unspecified eye, unspecified eyelid: Secondary | ICD-10-CM | POA: Diagnosis not present

## 2014-10-24 DIAGNOSIS — H25041 Posterior subcapsular polar age-related cataract, right eye: Secondary | ICD-10-CM | POA: Diagnosis not present

## 2014-10-24 DIAGNOSIS — L821 Other seborrheic keratosis: Secondary | ICD-10-CM | POA: Diagnosis not present

## 2014-10-24 DIAGNOSIS — D225 Melanocytic nevi of trunk: Secondary | ICD-10-CM | POA: Diagnosis not present

## 2014-10-24 DIAGNOSIS — D485 Neoplasm of uncertain behavior of skin: Secondary | ICD-10-CM | POA: Diagnosis not present

## 2014-10-24 DIAGNOSIS — H2511 Age-related nuclear cataract, right eye: Secondary | ICD-10-CM | POA: Diagnosis not present

## 2014-10-24 DIAGNOSIS — D2271 Melanocytic nevi of right lower limb, including hip: Secondary | ICD-10-CM | POA: Diagnosis not present

## 2014-10-24 DIAGNOSIS — L570 Actinic keratosis: Secondary | ICD-10-CM | POA: Diagnosis not present

## 2014-10-24 DIAGNOSIS — H26492 Other secondary cataract, left eye: Secondary | ICD-10-CM | POA: Diagnosis not present

## 2014-10-24 DIAGNOSIS — D1801 Hemangioma of skin and subcutaneous tissue: Secondary | ICD-10-CM | POA: Diagnosis not present

## 2014-10-25 DIAGNOSIS — S83281D Other tear of lateral meniscus, current injury, right knee, subsequent encounter: Secondary | ICD-10-CM | POA: Diagnosis not present

## 2014-11-07 ENCOUNTER — Other Ambulatory Visit: Payer: Self-pay | Admitting: Dermatology

## 2014-11-07 DIAGNOSIS — L97219 Non-pressure chronic ulcer of right calf with unspecified severity: Secondary | ICD-10-CM | POA: Diagnosis not present

## 2014-11-07 DIAGNOSIS — D485 Neoplasm of uncertain behavior of skin: Secondary | ICD-10-CM | POA: Diagnosis not present

## 2014-11-10 ENCOUNTER — Encounter: Payer: Self-pay | Admitting: Internal Medicine

## 2014-11-10 DIAGNOSIS — Z0000 Encounter for general adult medical examination without abnormal findings: Secondary | ICD-10-CM | POA: Diagnosis not present

## 2014-12-25 DIAGNOSIS — H25811 Combined forms of age-related cataract, right eye: Secondary | ICD-10-CM | POA: Diagnosis not present

## 2014-12-25 DIAGNOSIS — H2511 Age-related nuclear cataract, right eye: Secondary | ICD-10-CM | POA: Diagnosis not present

## 2014-12-27 DIAGNOSIS — J22 Unspecified acute lower respiratory infection: Secondary | ICD-10-CM | POA: Diagnosis not present

## 2015-01-18 ENCOUNTER — Encounter: Payer: Medicare Other | Admitting: Internal Medicine

## 2015-02-07 DIAGNOSIS — Z1231 Encounter for screening mammogram for malignant neoplasm of breast: Secondary | ICD-10-CM | POA: Diagnosis not present

## 2015-03-26 ENCOUNTER — Other Ambulatory Visit: Payer: Self-pay | Admitting: Dermatology

## 2015-03-26 DIAGNOSIS — C44729 Squamous cell carcinoma of skin of left lower limb, including hip: Secondary | ICD-10-CM | POA: Diagnosis not present

## 2015-03-26 DIAGNOSIS — D485 Neoplasm of uncertain behavior of skin: Secondary | ICD-10-CM | POA: Diagnosis not present

## 2015-03-26 DIAGNOSIS — L820 Inflamed seborrheic keratosis: Secondary | ICD-10-CM | POA: Diagnosis not present

## 2015-03-26 DIAGNOSIS — L821 Other seborrheic keratosis: Secondary | ICD-10-CM | POA: Diagnosis not present

## 2015-04-02 DIAGNOSIS — J22 Unspecified acute lower respiratory infection: Secondary | ICD-10-CM | POA: Diagnosis not present

## 2015-04-24 DIAGNOSIS — D225 Melanocytic nevi of trunk: Secondary | ICD-10-CM | POA: Diagnosis not present

## 2015-04-24 DIAGNOSIS — Z85828 Personal history of other malignant neoplasm of skin: Secondary | ICD-10-CM | POA: Diagnosis not present

## 2015-04-24 DIAGNOSIS — L821 Other seborrheic keratosis: Secondary | ICD-10-CM | POA: Diagnosis not present

## 2015-04-24 DIAGNOSIS — D2272 Melanocytic nevi of left lower limb, including hip: Secondary | ICD-10-CM | POA: Diagnosis not present

## 2015-04-24 DIAGNOSIS — L570 Actinic keratosis: Secondary | ICD-10-CM | POA: Diagnosis not present

## 2015-04-24 DIAGNOSIS — I8392 Asymptomatic varicose veins of left lower extremity: Secondary | ICD-10-CM | POA: Diagnosis not present

## 2015-04-24 DIAGNOSIS — D1801 Hemangioma of skin and subcutaneous tissue: Secondary | ICD-10-CM | POA: Diagnosis not present

## 2015-05-31 DIAGNOSIS — L820 Inflamed seborrheic keratosis: Secondary | ICD-10-CM | POA: Diagnosis not present

## 2015-05-31 DIAGNOSIS — L821 Other seborrheic keratosis: Secondary | ICD-10-CM | POA: Diagnosis not present

## 2015-05-31 DIAGNOSIS — Z85828 Personal history of other malignant neoplasm of skin: Secondary | ICD-10-CM | POA: Diagnosis not present

## 2015-07-11 DIAGNOSIS — M1711 Unilateral primary osteoarthritis, right knee: Secondary | ICD-10-CM | POA: Diagnosis not present

## 2015-09-04 DIAGNOSIS — Z961 Presence of intraocular lens: Secondary | ICD-10-CM | POA: Diagnosis not present

## 2015-09-04 DIAGNOSIS — H02839 Dermatochalasis of unspecified eye, unspecified eyelid: Secondary | ICD-10-CM | POA: Diagnosis not present

## 2015-09-04 DIAGNOSIS — H0230 Blepharochalasis unspecified eye, unspecified eyelid: Secondary | ICD-10-CM | POA: Diagnosis not present

## 2015-09-11 DIAGNOSIS — B029 Zoster without complications: Secondary | ICD-10-CM | POA: Diagnosis not present

## 2015-09-11 DIAGNOSIS — R109 Unspecified abdominal pain: Secondary | ICD-10-CM | POA: Diagnosis not present

## 2015-09-11 DIAGNOSIS — R21 Rash and other nonspecific skin eruption: Secondary | ICD-10-CM | POA: Diagnosis not present

## 2015-09-12 DIAGNOSIS — B029 Zoster without complications: Secondary | ICD-10-CM | POA: Diagnosis not present

## 2015-09-17 DIAGNOSIS — B029 Zoster without complications: Secondary | ICD-10-CM | POA: Diagnosis not present

## 2015-10-08 DIAGNOSIS — Z85828 Personal history of other malignant neoplasm of skin: Secondary | ICD-10-CM | POA: Diagnosis not present

## 2015-10-08 DIAGNOSIS — B0229 Other postherpetic nervous system involvement: Secondary | ICD-10-CM | POA: Diagnosis not present

## 2015-10-09 DIAGNOSIS — B0229 Other postherpetic nervous system involvement: Secondary | ICD-10-CM | POA: Diagnosis not present

## 2015-10-23 DIAGNOSIS — D1801 Hemangioma of skin and subcutaneous tissue: Secondary | ICD-10-CM | POA: Diagnosis not present

## 2015-10-23 DIAGNOSIS — Z85828 Personal history of other malignant neoplasm of skin: Secondary | ICD-10-CM | POA: Diagnosis not present

## 2015-10-23 DIAGNOSIS — D692 Other nonthrombocytopenic purpura: Secondary | ICD-10-CM | POA: Diagnosis not present

## 2015-10-23 DIAGNOSIS — L570 Actinic keratosis: Secondary | ICD-10-CM | POA: Diagnosis not present

## 2015-11-08 DIAGNOSIS — B0229 Other postherpetic nervous system involvement: Secondary | ICD-10-CM | POA: Diagnosis not present

## 2015-11-08 DIAGNOSIS — B029 Zoster without complications: Secondary | ICD-10-CM | POA: Diagnosis not present

## 2016-01-23 DIAGNOSIS — M2022 Hallux rigidus, left foot: Secondary | ICD-10-CM | POA: Diagnosis not present

## 2016-02-13 DIAGNOSIS — Z1231 Encounter for screening mammogram for malignant neoplasm of breast: Secondary | ICD-10-CM | POA: Diagnosis not present

## 2016-03-03 ENCOUNTER — Other Ambulatory Visit: Payer: Self-pay | Admitting: Orthopedic Surgery

## 2016-03-31 DIAGNOSIS — D0361 Melanoma in situ of right upper limb, including shoulder: Secondary | ICD-10-CM | POA: Diagnosis not present

## 2016-03-31 DIAGNOSIS — Z85828 Personal history of other malignant neoplasm of skin: Secondary | ICD-10-CM | POA: Diagnosis not present

## 2016-03-31 DIAGNOSIS — D485 Neoplasm of uncertain behavior of skin: Secondary | ICD-10-CM | POA: Diagnosis not present

## 2016-03-31 DIAGNOSIS — L300 Nummular dermatitis: Secondary | ICD-10-CM | POA: Diagnosis not present

## 2016-04-14 DIAGNOSIS — D0361 Melanoma in situ of right upper limb, including shoulder: Secondary | ICD-10-CM | POA: Diagnosis not present

## 2016-04-14 DIAGNOSIS — Z85828 Personal history of other malignant neoplasm of skin: Secondary | ICD-10-CM | POA: Diagnosis not present

## 2016-04-14 DIAGNOSIS — L089 Local infection of the skin and subcutaneous tissue, unspecified: Secondary | ICD-10-CM | POA: Diagnosis not present

## 2016-04-18 DIAGNOSIS — M2022 Hallux rigidus, left foot: Secondary | ICD-10-CM | POA: Diagnosis not present

## 2016-04-24 ENCOUNTER — Encounter (HOSPITAL_BASED_OUTPATIENT_CLINIC_OR_DEPARTMENT_OTHER): Payer: Self-pay | Admitting: *Deleted

## 2016-05-01 ENCOUNTER — Ambulatory Visit (HOSPITAL_BASED_OUTPATIENT_CLINIC_OR_DEPARTMENT_OTHER)
Admission: RE | Admit: 2016-05-01 | Discharge: 2016-05-01 | Disposition: A | Discharge status: Home / Self Care | Payer: Medicare Other | Source: Ambulatory Visit | Attending: Orthopedic Surgery | Admitting: Orthopedic Surgery

## 2016-05-01 ENCOUNTER — Encounter (HOSPITAL_BASED_OUTPATIENT_CLINIC_OR_DEPARTMENT_OTHER)
Admission: RE | Disposition: A | Discharge status: Home / Self Care | Payer: Self-pay | Source: Ambulatory Visit | Attending: Orthopedic Surgery

## 2016-05-01 ENCOUNTER — Ambulatory Visit (HOSPITAL_BASED_OUTPATIENT_CLINIC_OR_DEPARTMENT_OTHER): Payer: Medicare Other | Admitting: Anesthesiology

## 2016-05-01 ENCOUNTER — Encounter (HOSPITAL_BASED_OUTPATIENT_CLINIC_OR_DEPARTMENT_OTHER): Payer: Self-pay | Admitting: Certified Registered"

## 2016-05-01 DIAGNOSIS — M2022 Hallux rigidus, left foot: Secondary | ICD-10-CM | POA: Diagnosis not present

## 2016-05-01 DIAGNOSIS — M2032 Hallux varus (acquired), left foot: Secondary | ICD-10-CM | POA: Diagnosis not present

## 2016-05-01 DIAGNOSIS — M1711 Unilateral primary osteoarthritis, right knee: Secondary | ICD-10-CM | POA: Insufficient documentation

## 2016-05-01 DIAGNOSIS — G8918 Other acute postprocedural pain: Secondary | ICD-10-CM | POA: Diagnosis not present

## 2016-05-01 DIAGNOSIS — M2012 Hallux valgus (acquired), left foot: Secondary | ICD-10-CM | POA: Diagnosis not present

## 2016-05-01 DIAGNOSIS — M79672 Pain in left foot: Secondary | ICD-10-CM | POA: Diagnosis not present

## 2016-05-01 DIAGNOSIS — Z9889 Other specified postprocedural states: Secondary | ICD-10-CM

## 2016-05-01 HISTORY — PX: ARTHRODESIS METATARSALPHALANGEAL JOINT (MTPJ): SHX6566

## 2016-05-01 HISTORY — PX: INJECTION KNEE: SHX2446

## 2016-05-01 SURGERY — FUSION, JOINT, GREAT TOE
Anesthesia: General | Site: Knee | Laterality: Right

## 2016-05-01 MED ORDER — CHLORHEXIDINE GLUCONATE 4 % EX LIQD: 60.0000 mL | Freq: Once | CUTANEOUS | Status: DC

## 2016-05-01 MED ORDER — LIDOCAINE-EPINEPHRINE (PF) 1.5 %-1:200000 IJ SOLN
INTRAMUSCULAR | Status: DC | PRN
  Administered 2016-05-01: 10 mL via PERINEURAL

## 2016-05-01 MED ORDER — OXYCODONE HCL 5 MG PO TABS: 5.0000 mg | ORAL_TABLET | Freq: Once | ORAL | Status: DC | PRN

## 2016-05-01 MED ORDER — DEXAMETHASONE SODIUM PHOSPHATE 10 MG/ML IJ SOLN
INTRAMUSCULAR | Status: AC
  Filled 2016-05-01: qty 1

## 2016-05-01 MED ORDER — 0.9 % SODIUM CHLORIDE (POUR BTL) OPTIME
TOPICAL | Status: DC | PRN
  Administered 2016-05-01: 200 mL

## 2016-05-01 MED ORDER — SENNA 8.6 MG PO TABS: 2.0000 | ORAL_TABLET | Freq: Two times a day (BID) | ORAL | Status: DC

## 2016-05-01 MED ORDER — GLYCOPYRROLATE 0.2 MG/ML IJ SOLN: 0.2000 mg | Freq: Once | INTRAMUSCULAR | Status: DC | PRN

## 2016-05-01 MED ORDER — CEFAZOLIN SODIUM-DEXTROSE 2-4 GM/100ML-% IV SOLN
INTRAVENOUS | Status: AC
Start: 2016-05-01 — End: 2016-05-01
  Filled 2016-05-01: qty 100

## 2016-05-01 MED ORDER — HYDROMORPHONE HCL 1 MG/ML IJ SOLN
0.2500 mg | INTRAMUSCULAR | Status: DC | PRN
  Administered 2016-05-01: 0.5 mg via INTRAVENOUS
  Administered 2016-05-01: 0.25 mg via INTRAVENOUS

## 2016-05-01 MED ORDER — EPHEDRINE SULFATE 50 MG/ML IJ SOLN
INTRAMUSCULAR | Status: DC | PRN
  Administered 2016-05-01: 10 mg via INTRAVENOUS

## 2016-05-01 MED ORDER — BUPIVACAINE HCL (PF) 0.5 % IJ SOLN
INTRAMUSCULAR | Status: DC | PRN
  Administered 2016-05-01: 20 mL via PERINEURAL

## 2016-05-01 MED ORDER — PROMETHAZINE HCL 25 MG/ML IJ SOLN: 6.2500 mg | INTRAMUSCULAR | Status: DC | PRN

## 2016-05-01 MED ORDER — LIDOCAINE HCL (CARDIAC) 20 MG/ML IV SOLN
INTRAVENOUS | Status: DC | PRN
  Administered 2016-05-01: 30 mg via INTRAVENOUS

## 2016-05-01 MED ORDER — DOCUSATE SODIUM 100 MG PO CAPS: 100.0000 mg | ORAL_CAPSULE | Freq: Two times a day (BID) | ORAL | Status: DC

## 2016-05-01 MED ORDER — LIDOCAINE HCL 1 % IJ SOLN
INTRAMUSCULAR | Status: DC | PRN
  Administered 2016-05-01: 9 mL

## 2016-05-01 MED ORDER — PROPOFOL 500 MG/50ML IV EMUL
INTRAVENOUS | Status: AC
  Filled 2016-05-01: qty 50

## 2016-05-01 MED ORDER — MIDAZOLAM HCL 2 MG/2ML IJ SOLN
INTRAMUSCULAR | Status: AC
  Filled 2016-05-01: qty 2

## 2016-05-01 MED ORDER — SODIUM CHLORIDE 0.9 % IV SOLN: INTRAVENOUS | Status: DC

## 2016-05-01 MED ORDER — PROPOFOL 10 MG/ML IV BOLUS
INTRAVENOUS | Status: DC | PRN
  Administered 2016-05-01: 150 mg via INTRAVENOUS

## 2016-05-01 MED ORDER — TRIAMCINOLONE ACETONIDE 40 MG/ML IJ SUSP
INTRAMUSCULAR | Status: DC | PRN
  Administered 2016-05-01: 40 mg via INTRA_ARTICULAR

## 2016-05-01 MED ORDER — HYDROMORPHONE HCL 1 MG/ML IJ SOLN
INTRAMUSCULAR | Status: AC
  Filled 2016-05-01: qty 1

## 2016-05-01 MED ORDER — DEXTROSE 5 % IV SOLN
2.0000 g | INTRAVENOUS | Status: AC
  Administered 2016-05-01: 2 g via INTRAVENOUS

## 2016-05-01 MED ORDER — IBUPROFEN 800 MG PO TABS: 800.0000 mg | ORAL_TABLET | Freq: Three times a day (TID) | ORAL | Status: DC | PRN

## 2016-05-01 MED ORDER — FENTANYL CITRATE (PF) 100 MCG/2ML IJ SOLN
50.0000 ug | INTRAMUSCULAR | Status: DC | PRN
  Administered 2016-05-01: 50 ug via INTRAVENOUS

## 2016-05-01 MED ORDER — TRIAMCINOLONE ACETONIDE 40 MG/ML IJ SUSP
INTRAMUSCULAR | Status: AC
  Filled 2016-05-01: qty 5

## 2016-05-01 MED ORDER — OXYCODONE HCL 5 MG PO TABS: 5.0000 mg | ORAL_TABLET | ORAL | Status: DC | PRN

## 2016-05-01 MED ORDER — LIDOCAINE HCL (PF) 1 % IJ SOLN
INTRAMUSCULAR | Status: AC
  Filled 2016-05-01: qty 5

## 2016-05-01 MED ORDER — LIDOCAINE 2% (20 MG/ML) 5 ML SYRINGE
INTRAMUSCULAR | Status: AC
  Filled 2016-05-01: qty 5

## 2016-05-01 MED ORDER — OXYCODONE HCL 5 MG/5ML PO SOLN: 5.0000 mg | Freq: Once | ORAL | Status: DC | PRN

## 2016-05-01 MED ORDER — MIDAZOLAM HCL 2 MG/2ML IJ SOLN
1.0000 mg | INTRAMUSCULAR | Status: DC | PRN
  Administered 2016-05-01: 1 mg via INTRAVENOUS

## 2016-05-01 MED ORDER — ONDANSETRON HCL 4 MG/2ML IJ SOLN
INTRAMUSCULAR | Status: AC
  Filled 2016-05-01: qty 2

## 2016-05-01 MED ORDER — DEXAMETHASONE SODIUM PHOSPHATE 10 MG/ML IJ SOLN
INTRAMUSCULAR | Status: DC | PRN
  Administered 2016-05-01: 10 mg via INTRAVENOUS

## 2016-05-01 MED ORDER — ONDANSETRON HCL 4 MG/2ML IJ SOLN
INTRAMUSCULAR | Status: DC | PRN
  Administered 2016-05-01: 4 mg via INTRAVENOUS

## 2016-05-01 MED ORDER — FENTANYL CITRATE (PF) 100 MCG/2ML IJ SOLN
INTRAMUSCULAR | Status: AC
  Filled 2016-05-01: qty 2

## 2016-05-01 MED ORDER — LIDOCAINE HCL (PF) 1 % IJ SOLN
INTRAMUSCULAR | Status: AC
  Filled 2016-05-01: qty 30

## 2016-05-01 MED ORDER — SCOPOLAMINE 1 MG/3DAYS TD PT72: 1.0000 | MEDICATED_PATCH | Freq: Once | TRANSDERMAL | Status: DC | PRN

## 2016-05-01 MED ORDER — LACTATED RINGERS IV SOLN
INTRAVENOUS | Status: DC
  Administered 2016-05-01 (×2): via INTRAVENOUS

## 2016-05-01 MED ORDER — BUPIVACAINE-EPINEPHRINE (PF) 0.5% -1:200000 IJ SOLN
INTRAMUSCULAR | Status: AC
  Filled 2016-05-01: qty 90

## 2016-05-01 SURGICAL SUPPLY — 80 items
BANDAGE ACE 4X5 VEL STRL LF (GAUZE/BANDAGES/DRESSINGS) ×2 IMPLANT
BANDAGE ESMARK 6X9 LF (GAUZE/BANDAGES/DRESSINGS) ×2 IMPLANT
BIT DRILL 2 FAST STEP (BIT) ×2 IMPLANT
BLADE AVERAGE 25MMX9MM (BLADE)
BLADE AVERAGE 25X9 (BLADE) IMPLANT
BLADE MICRO SAGITTAL (BLADE) IMPLANT
BLADE OSC/SAG .038X5.5 CUT EDG (BLADE) IMPLANT
BLADE SURG 15 STRL LF DISP TIS (BLADE) ×4 IMPLANT
BLADE SURG 15 STRL SS (BLADE) ×12
BNDG CMPR 9X6 STRL LF SNTH (GAUZE/BANDAGES/DRESSINGS) ×2
BNDG COHESIVE 4X5 TAN STRL (GAUZE/BANDAGES/DRESSINGS) ×4 IMPLANT
BNDG COHESIVE 6X5 TAN STRL LF (GAUZE/BANDAGES/DRESSINGS) ×2 IMPLANT
BNDG CONFORM 2 STRL LF (GAUZE/BANDAGES/DRESSINGS) ×2 IMPLANT
BNDG ESMARK 6X9 LF (GAUZE/BANDAGES/DRESSINGS) ×4
BOOT STEPPER DURA MED (SOFTGOODS) ×2 IMPLANT
CHLORAPREP W/TINT 26ML (MISCELLANEOUS) ×4 IMPLANT
COVER BACK TABLE 60X90IN (DRAPES) ×4 IMPLANT
CUFF TOURNIQUET SINGLE 34IN LL (TOURNIQUET CUFF) ×4 IMPLANT
DRAPE EXTREMITY T 121X128X90 (DRAPE) ×4 IMPLANT
DRAPE OEC MINIVIEW 54X84 (DRAPES) ×4 IMPLANT
DRAPE U-SHAPE 47X51 STRL (DRAPES) ×4 IMPLANT
DRSG MEPITEL 4X7.2 (GAUZE/BANDAGES/DRESSINGS) ×4 IMPLANT
DRSG PAD ABDOMINAL 8X10 ST (GAUZE/BANDAGES/DRESSINGS) ×8 IMPLANT
ELECT REM PT RETURN 9FT ADLT (ELECTROSURGICAL) ×4
ELECTRODE REM PT RTRN 9FT ADLT (ELECTROSURGICAL) ×2 IMPLANT
GAUZE SPONGE 4X4 12PLY STRL (GAUZE/BANDAGES/DRESSINGS) ×4 IMPLANT
GLOVE BIO SURGEON STRL SZ8 (GLOVE) ×4 IMPLANT
GLOVE BIOGEL PI IND STRL 7.0 (GLOVE) IMPLANT
GLOVE BIOGEL PI IND STRL 8 (GLOVE) ×4 IMPLANT
GLOVE BIOGEL PI INDICATOR 7.0 (GLOVE) ×4
GLOVE BIOGEL PI INDICATOR 8 (GLOVE) ×4
GLOVE ECLIPSE 6.5 STRL STRAW (GLOVE) ×2 IMPLANT
GLOVE ECLIPSE 7.5 STRL STRAW (GLOVE) ×4 IMPLANT
GLOVE EXAM NITRILE MD LF STRL (GLOVE) IMPLANT
GOWN STRL REUS W/ TWL LRG LVL3 (GOWN DISPOSABLE) ×2 IMPLANT
GOWN STRL REUS W/ TWL XL LVL3 (GOWN DISPOSABLE) ×4 IMPLANT
GOWN STRL REUS W/TWL LRG LVL3 (GOWN DISPOSABLE) ×4
GOWN STRL REUS W/TWL XL LVL3 (GOWN DISPOSABLE) ×8
K-WIRE ACE 1.6X6 (WIRE) ×8
KWIRE ACE 1.6X6 (WIRE) IMPLANT
NDL SAFETY ECLIPSE 18X1.5 (NEEDLE) IMPLANT
NEEDLE HYPO 18GX1.5 SHARP (NEEDLE) ×8
NEEDLE HYPO 22GX1.5 SAFETY (NEEDLE) ×2 IMPLANT
PACK BASIN DAY SURGERY FS (CUSTOM PROCEDURE TRAY) ×4 IMPLANT
PAD CAST 4YDX4 CTTN HI CHSV (CAST SUPPLIES) ×2 IMPLANT
PADDING CAST ABS 4INX4YD NS (CAST SUPPLIES)
PADDING CAST ABS COTTON 4X4 ST (CAST SUPPLIES) IMPLANT
PADDING CAST COTTON 4X4 STRL (CAST SUPPLIES) ×4
PADDING CAST COTTON 6X4 STRL (CAST SUPPLIES) ×2 IMPLANT
PENCIL BUTTON HOLSTER BLD 10FT (ELECTRODE) ×4 IMPLANT
PLATE LOCK 1ST LT MTP (Plate) ×2 IMPLANT
SANITIZER HAND PURELL 535ML FO (MISCELLANEOUS) ×4 IMPLANT
SCREW CANN 4.0X30 (Screw) ×4 IMPLANT
SCREW CANN PT 30X4XCANN NS SM (Screw) ×2 IMPLANT
SCREW CANN PT 4X30 NS (Screw) IMPLANT
SCREW PEG 2.5X16 NONLOCK (Screw) ×2 IMPLANT
SCREW PEG 2.5X18 NONLOCK (Screw) ×4 IMPLANT
SCREW PEG LOCK 2.5X18 (Peg) ×6 IMPLANT
SCREW PEG LOCK 2.5X20 (Peg) ×2 IMPLANT
SHEET MEDIUM DRAPE 40X70 STRL (DRAPES) ×4 IMPLANT
SLEEVE SCD COMPRESS KNEE MED (MISCELLANEOUS) ×4 IMPLANT
SPLINT FAST PLASTER 5X30 (CAST SUPPLIES)
SPLINT PLASTER CAST FAST 5X30 (CAST SUPPLIES) ×40 IMPLANT
SPONGE LAP 18X18 X RAY DECT (DISPOSABLE) ×4 IMPLANT
SPONGE SURGIFOAM ABS GEL 12-7 (HEMOSTASIS) IMPLANT
STOCKINETTE 6  STRL (DRAPES) ×2
STOCKINETTE 6 STRL (DRAPES) ×2 IMPLANT
SUCTION FRAZIER HANDLE 10FR (MISCELLANEOUS) ×2
SUCTION TUBE FRAZIER 10FR DISP (MISCELLANEOUS) ×2 IMPLANT
SUT ETHILON 3 0 PS 1 (SUTURE) ×4 IMPLANT
SUT MNCRL AB 3-0 PS2 18 (SUTURE) ×4 IMPLANT
SUT VIC AB 0 SH 27 (SUTURE) IMPLANT
SUT VIC AB 2-0 SH 27 (SUTURE)
SUT VIC AB 2-0 SH 27XBRD (SUTURE) IMPLANT
SYR BULB 3OZ (MISCELLANEOUS) ×4 IMPLANT
SYR CONTROL 10ML LL (SYRINGE) IMPLANT
TOWEL OR 17X24 6PK STRL BLUE (TOWEL DISPOSABLE) ×8 IMPLANT
TUBE CONNECTING 20'X1/4 (TUBING) ×1
TUBE CONNECTING 20X1/4 (TUBING) ×3 IMPLANT
UNDERPAD 30X30 (UNDERPADS AND DIAPERS) ×4 IMPLANT

## 2016-05-01 NOTE — Anesthesia Postprocedure Evaluation (Signed)
Anesthesia Post Note  Patient: Cassandra Underwood  Procedure(s) Performed: Procedure(s) (LRB): LEFT ARTHRODESIS METATARSALPHALANGEAL JOINT (MTPJ)  (Left) KNEE INJECTION (Right)  Patient location during evaluation: PACU Anesthesia Type: General Level of consciousness: awake and alert Pain management: pain level controlled Vital Signs Assessment: post-procedure vital signs reviewed and stable Respiratory status: spontaneous breathing, nonlabored ventilation, respiratory function stable and patient connected to nasal cannula oxygen Cardiovascular status: blood pressure returned to baseline and stable Postop Assessment: no signs of nausea or vomiting Anesthetic complications: no    Last Vitals:  Filed Vitals:   05/01/16 0845 05/01/16 0900  BP: 132/79 154/83  Pulse: 92 81  Temp:    Resp: 15 13    Last Pain:  Filed Vitals:   05/01/16 0904  PainSc: 3     LLE Motor Response: Purposeful movement (05/01/16 0900)            Rebecca Motta S

## 2016-05-01 NOTE — Transfer of Care (Signed)
Immediate Anesthesia Transfer of Care Note  Patient: Cassandra Underwood  Procedure(s) Performed: Procedure(s): LEFT ARTHRODESIS METATARSALPHALANGEAL JOINT (MTPJ) AND POSSIBLE EXTENSOR TENDON LENGTHENING (Left) KNEE INJECTION (Right)  Patient Location: PACU  Anesthesia Type:GA combined with regional for post-op pain  Level of Consciousness: awake, alert , oriented and patient cooperative  Airway & Oxygen Therapy: Patient Spontanous Breathing and Patient connected to face mask oxygen  Post-op Assessment: Report given to RN and Post -op Vital signs reviewed and stable  Post vital signs: Reviewed and stable  Last Vitals:  Filed Vitals:   05/01/16 0715 05/01/16 0716  BP:    Pulse: 68 67  Temp:    Resp: 14 12    Last Pain:  Filed Vitals:   05/01/16 0716  PainSc: 7       Patients Stated Pain Goal: 4 ([id number])  Complications: No apparent anesthesia complications

## 2016-05-01 NOTE — Anesthesia Preprocedure Evaluation (Addendum)
Anesthesia Evaluation  Patient identified by MRN, date of birth, ID band Patient awake    Reviewed: Allergy & Precautions, NPO status , Patient's Chart, lab work & pertinent test results  History of Anesthesia Complications (+) PONV  Airway Mallampati: II  TM Distance: >3 FB Neck ROM: Full    Dental no notable dental hx.    Pulmonary asthma ,    Pulmonary exam normal breath sounds clear to auscultation       Cardiovascular negative cardio ROS Normal cardiovascular exam Rhythm:Regular Rate:Normal     Neuro/Psych negative neurological ROS  negative psych ROS   GI/Hepatic negative GI ROS, Neg liver ROS,   Endo/Other  negative endocrine ROS  Renal/GU negative Renal ROS  negative genitourinary   Musculoskeletal negative musculoskeletal ROS (+)   Abdominal   Peds negative pediatric ROS (+)  Hematology negative hematology ROS (+)   Anesthesia Other Findings   Reproductive/Obstetrics negative OB ROS                            Anesthesia Physical Anesthesia Plan  ASA: I  Anesthesia Plan: General   Post-op Pain Management:    Induction: Intravenous  Airway Management Planned: LMA  Additional Equipment:   Intra-op Plan:   Post-operative Plan: Extubation in OR  Informed Consent: I have reviewed the patients History and Physical, chart, labs and discussed the procedure including the risks, benefits and alternatives for the proposed anesthesia with the patient or authorized representative who has indicated his/her understanding and acceptance.   Dental advisory given  Plan Discussed with: CRNA and Surgeon  Anesthesia Plan Comments:         Anesthesia Quick Evaluation

## 2016-05-01 NOTE — Discharge Instructions (Addendum)
Cassandra Simmer, MD [place]  Please read the following information regarding your care after surgery.  Medications  You only need a prescription for the narcotic pain medicine (ex. oxycodone, Percocet, Norco).  All of the other medicines listed below are available over the counter. X acetominophen (Tylenol) 650 mg every 4-6 hours as you need for minor pain X oxycodone as prescribed for moderate to severe pain ?   Narcotic pain medicine (ex. oxycodone, Percocet, Vicodin) will cause constipation.  To prevent this problem, take the following medicines while you are taking any pain medicine. X docusate sodium (Colace) 100 mg twice a day X senna (Senokot) 2 tablets twice a day  Weight Bearing X Bear weight when you are able on your operated leg or foot in CAM boot.     Cast / Splint / Dressing X Keep your dressing clean and dry.  Dont put anything (coat hanger, pencil, etc) down inside of it.  If it gets damp, use a hair dryer on the cool setting to dry it.  If it gets soaked, call the office to schedule an appointment for a dressing change.   After your dressing, cast or splint is removed; you may shower, but do not soak or scrub the wound.  Allow the water to run over it, and then gently pat it dry.  Swelling It is normal for you to have swelling where you had surgery.  To reduce swelling and pain, keep your toes above your nose for at least 3 days after surgery.  It may be necessary to keep your foot or leg elevated for several weeks.  If it hurts, it should be elevated.  Follow Up Call my office at [phone] when you are discharged from the hospital or surgery center to schedule an appointment to be seen two weeks after surgery.  Call my office at [phone] if you develop a fever >101.5 F, nausea, vomiting, bleeding from the surgical site or severe pain.      Regional Anesthesia Blocks  1. Numbness or the inability to move the "blocked" extremity may last from  3-48 hours after placement. The length of time depends on the medication injected and your individual response to the medication. If the numbness is not going away after 48 hours, call your surgeon.  2. The extremity that is blocked will need to be protected until the numbness is gone and the  Strength has returned. Because you cannot feel it, you will need to take extra care to avoid injury. Because it may be weak, you may have difficulty moving it or using it. You may not know what position it is in without looking at it while the block is in effect.  3. For blocks in the legs and feet, returning to weight bearing and walking needs to be done carefully. You will need to wait until the numbness is entirely gone and the strength has returned. You should be able to move your leg and foot normally before you try and bear weight or walk. You will need someone to be with you when you first try to ensure you do not fall and possibly risk injury.  4. Bruising and tenderness at the needle site are common side effects and will resolve in a few days.  5. Persistent numbness or new problems with movement should be communicated to the surgeon or the [facility] [phone] [place] [phone]).    Post Anesthesia Home Care Instructions  Activity: Get plenty  of rest for the remainder of the day. A responsible adult should stay with you for 24 hours following the procedure.  For the next 24 hours, DO NOT: -Drive a car -Paediatric nurse -Drink alcoholic beverages -Take any medication unless instructed by your physician -Make any legal decisions or sign important papers.  Meals: Start with liquid foods such as gelatin or soup. Progress to regular foods as tolerated. Avoid greasy, spicy, heavy foods. If nausea and/or vomiting occur, drink only clear liquids until the nausea and/or vomiting subsides. Call your physician if vomiting continues.  Special  Instructions/Symptoms: Your throat may feel dry or sore from the anesthesia or the breathing tube placed in your throat during surgery. If this causes discomfort, gargle with warm salt water. The discomfort should disappear within 24 hours.  If you had a scopolamine patch placed behind your ear for the management of post- operative nausea and/or vomiting:  1. The medication in the patch is effective for 72 hours, after which it should be removed.  Wrap patch in a tissue and discard in the trash. Wash hands thoroughly with soap and water. 2. You may remove the patch earlier than 72 hours if you experience unpleasant side effects which may include dry mouth, dizziness or visual disturbances. 3. Avoid touching the patch. Wash your hands with soap and water after contact with the patch.

## 2016-05-01 NOTE — Progress Notes (Signed)
Assisted Dr. Rose with left, ultrasound guided, popliteal block. Side rails up, monitors on throughout procedure. See vital signs in flow sheet. Tolerated Procedure well. 

## 2016-05-01 NOTE — Brief Op Note (Signed)
05/01/2016  8:44 AM  PATIENT:  Cassandra Underwood  75 y.o. female  PRE-OPERATIVE DIAGNOSIS:  left hallux valgus and hallux rigidus, right knee arthritis  POST-OPERATIVE DIAGNOSIS:  left hallux valgus and hallux rigidus, right knee arthritis  Procedure(s): 1.  Left hallux MTP joint arthrodesis 2.  Right knee intra-articular steroid injection 3.  AP and lateral xrays of the left foot  SURGEON:  Wylene Simmer, MD  ASSISTANT: Mechele Claude, PA-C  ANESTHESIA:   General, regional  EBL:  minimal   TOURNIQUET:   Total Tourniquet Time Documented: Thigh (Left) - 39 minutes Total: Thigh (Left) - 39 minutes  COMPLICATIONS:  None apparent  DISPOSITION:  Extubated, awake and stable to recovery.  DICTATION ID:  [id number]

## 2016-05-01 NOTE — Anesthesia Procedure Notes (Addendum)
Anesthesia Regional Block:  Popliteal block  Pre-Anesthetic Checklist: ,, timeout performed, Correct Patient, Correct Site, Correct Laterality, Correct Procedure, Correct Position, site marked, Risks and benefits discussed,  Surgical consent,  Pre-op evaluation,  At surgeon's request and post-op pain management  Laterality: Left  Prep: chloraprep       Needles:  Injection technique: Single-shot  Needle Type: Echogenic Stimulator Needle     Needle Length: 9cm 9 cm Needle Gauge: 21 and 21 G    Additional Needles:  Procedures: ultrasound guided (picture in chart) Popliteal block Narrative:  Injection made incrementally with aspirations every 5 mL.  Performed by: Personally   Additional Notes: Patient tolerated the procedure well without complications   Procedure Name: LMA Insertion Date/Time: 05/01/2016 7:33 AM Performed by: Lamari Youngers D Pre-anesthesia Checklist: Patient identified, Emergency Drugs available, Suction available and Patient being monitored Patient Re-evaluated:Patient Re-evaluated prior to inductionOxygen Delivery Method: Circle System Utilized Preoxygenation: Pre-oxygenation with 100% oxygen Intubation Type: IV induction Ventilation: Mask ventilation without difficulty LMA: LMA inserted LMA Size: 4.0 Number of attempts: 1 Airway Equipment and Method: Bite block Placement Confirmation: positive ETCO2 Tube secured with: Tape Dental Injury: Teeth and Oropharynx as per pre-operative assessment

## 2016-05-02 ENCOUNTER — Encounter (HOSPITAL_BASED_OUTPATIENT_CLINIC_OR_DEPARTMENT_OTHER): Payer: Self-pay | Admitting: Orthopedic Surgery

## 2016-05-02 NOTE — Op Note (Signed)
Cassandra Underwood, Cassandra Underwood NO.:  [id number]  MEDICAL RECORD NO.:  [id number]  LOCATION:                                 FACILITY:  PHYSICIAN:  Wylene Simmer, MD             DATE OF BIRTH:  DATE OF PROCEDURE:  05/01/2016 DATE OF DISCHARGE:                              OPERATIVE REPORT   PREOPERATIVE DIAGNOSES: 1. Left hallux valgus and hallux rigidus. 2. Right knee arthritis.  POSTOPERATIVE DIAGNOSIS: 1. Left hallux valgus and hallux rigidus. 2. Right knee arthritis.  PROCEDURE: 1. Right knee intra-articular steroid injection. 2. Left hallux metatarsophalangeal joint arthrodesis. 3. AP and lateral radiographs of the left foot.  SURGEON:  Wylene Simmer, MD.  ASSISTANT:  Mechele Claude, PA-C.  ANESTHESIA:  General, regional.  ESTIMATED BLOOD LOSS:  Minimal.  TOURNIQUET TIME:  39 minutes at 250 mmHg.  COMPLICATIONS:  None apparent.  DISPOSITION:  Extubated, awake, and stable to recovery.  INDICATION FOR PROCEDURE:  The patient is a 75 year old woman with a history of multiple left forefoot surgeries.  She has developed a painful cock-up toe deformity with contracture of the extensor hallucis longus tendon and hallux valgus.  She also has symptoms of hallux rigidus with degenerative changes evident on x-ray.  She has failed nonoperative treatment to date.  Presents today for operative correction of this painful condition.  She understands the risks and benefits, the alternative treatment options, and elects surgical treatment.  She specifically understands risks of bleeding, infection, nerve damage, blood clots, need for additional surgery, continued pain, nonunion, amputation, and death.  PROCEDURE IN DETAIL:  After preoperative consent was obtained and the correct operative site was identified, the patient was brought to the operating room and placed supine on the operating table.  General anesthesia was induced.  Preoperative antibiotics were  administered. Surgical time-out was taken.  Left lower extremity was prepped and draped in standard sterile fashion with tourniquet around the thigh. The extremity was exsanguinated, and the tourniquet was inflated to 250 mmHg.  A longitudinal incision was then made over the dorsum of the hallux MP joint.  Sharp dissection was carried down through skin and subcutaneous tissue.  The extensor hallucis longus tendon was mobilized. The dorsal joint capsule was incised and elevated medially and laterally exposing the metatarsal head.  A K-wire was inserted into the central aspect of metatarsal head, and a concave reamer was used to remove the remaining articular cartilage and subchondral bone.  The base of the proximal phalanx was exposed and the convex reamer was used to remove the remaining articular cartilage and subchondral bone.  Both sides of the joint were then perforated with a small drill bit leaving the resultant bone graft in place.  The joint was reduced and provisionally pinned.  AP and lateral radiographs confirmed appropriate alignment of the joint.  A 4 mm cannulated screw was then inserted from proximal- medial to distal-lateral.  This was noted to compress the joint appropriately.  A small Biomet ALPS hallux MP joint fusion plate was then selected.  It was applied to the dorsum of the joint and secured proximally with locking and nonlocking  screws and distally with locking and nonlocking screws.  AP and lateral radiographs confirmed appropriate alignment of the toe and appropriate position and length of all hardware.  Wound was irrigated.  Subcutaneous tissues were approximated with inverted simple sutures of 3-0 Monocryl and horizontal mattress sutures of 3-0 nylon were used to close the skin incision.  Sterile dressings were applied, followed by a compression wrap and a CAM walker boot.  Tourniquet was released after application of the dressings at 39 minutes.  The patient  was awakened from anesthesia and transported to recovery room in stable condition.  Prior to prepping and draping the left side, an intra-articular steroid injection of 1 mL of Kenalog and 9 mL of lidocaine were injected into the right knee through an inferolateral injection site.  She tolerated this procedure well.  There were no evident complications.  The patient was then awakened from anesthesia and transported to the recovery room in stable condition.  FOLLOWUP PLAN:  The patient will be weightbearing as tolerated on her left foot and a CAM walker boot.  She will leave in place.  She was instructed to see me in 2 weeks for suture removal and the dressing change.  Just Riley Nearing, was present and scrubbed for the duration of the case. His assistance was essential in positioning the patient, prepping and draping, gaining and maintaining exposure, performing the operation, closing and dressing the wounds, and applying the boot.  RADIOGRAPHS:  AP and lateral radiographs of the left foot were obtained intraoperatively.  These show interval arthrodesis of the left hallux MP joint.  Hardware is appropriately positioned and of the appropriate length.  No other acute injuries are noted.     Wylene Simmer, MD     JH/MEDQ  D:  05/01/2016  T:  05/02/2016  Job:  [id number]

## 2016-05-06 NOTE — H&P (Signed)
Cassandra Underwood is an 75 y.o. female.   Chief Complaint: L forefoot pain HPI: 75 y/o female with left hallux rigidus and forefoot pain.  She presnets now for operateive treatment.  Past Medical History  Diagnosis Date  . PONV (postoperative nausea and vomiting)   . Asthma     hx of uses no medications    Past Surgical History  Procedure Laterality Date  . Cholecystectomy    . Abdominal hysterectomy    . Eye surgery      cataract per left eye   . Tummy tuck    . Bilat foot surgery     . Knee arthroscopy Right 08/16/2014    Procedure: RIGHT ARTHROSCOPY WITH LATERAL KNEE MENISCUS DEBRIDEMENT AND CHONDROPLASTY;  Surgeon: Gearlean Alf, MD;  Location: [facility];  Service: Orthopedics;  Laterality: Right;  . Arthrodesis metatarsalphalangeal joint (mtpj) Left 05/01/2016    Procedure: LEFT ARTHRODESIS METATARSALPHALANGEAL JOINT (MTPJ) ;  Surgeon: Wylene Simmer, MD;  Location: [facility];  Service: Orthopedics;  Laterality: Left;  . Injection knee Right 05/01/2016    Procedure: KNEE INJECTION;  Surgeon: Wylene Simmer, MD;  Location: [facility];  Service: Orthopedics;  Laterality: Right;    History reviewed. No pertinent family history. Social History:  reports that she has never smoked. She has never used smokeless tobacco. She reports that she does not drink alcohol or use illicit drugs.  Allergies:  Allergies  Allergen Reactions  . Shrimp [Shellfish Allergy]     Headache     No prescriptions prior to admission    No results found for this or any previous visit (from the past 48 hour(s)). No results found.  ROS  No recent f/c/n/v/wt loss  Blood pressure 148/78, pulse 84, temperature 98 F (36.7 C), temperature source Oral, resp. rate 16, height 5\' 4"  (1.626 m), weight 62.596 kg (138 lb), SpO2 97 %. Physical Exam  wn wd woman in nad.  A and O X 4.  Mood and affect normal.  EOMI.  resp unlabord.  L foot with cocked up hallux.  SKin healthy and intact.   Decreased ROM at the hallux MPJ.  No lymphadenopathy.  NVI.  Assessment/Plan L hallux rigidus - to OR for left hallux MPJ arthrodesis.  The risks and benefits of the alternative treatment options have been discussed in detail.  The patient wishes to proceed with surgery and specifically understands risks of bleeding, infection, nerve damage, blood clots, need for additional surgery, amputation and death.   Wylene Simmer, MD Jun 05, 2016, 11:42 AM

## 2016-05-14 DIAGNOSIS — Z4789 Encounter for other orthopedic aftercare: Secondary | ICD-10-CM | POA: Diagnosis not present

## 2016-05-14 DIAGNOSIS — M2022 Hallux rigidus, left foot: Secondary | ICD-10-CM | POA: Diagnosis not present

## 2016-05-26 DIAGNOSIS — D485 Neoplasm of uncertain behavior of skin: Secondary | ICD-10-CM | POA: Diagnosis not present

## 2016-05-26 DIAGNOSIS — D225 Melanocytic nevi of trunk: Secondary | ICD-10-CM | POA: Diagnosis not present

## 2016-05-26 DIAGNOSIS — L821 Other seborrheic keratosis: Secondary | ICD-10-CM | POA: Diagnosis not present

## 2016-05-26 DIAGNOSIS — Z85820 Personal history of malignant melanoma of skin: Secondary | ICD-10-CM | POA: Diagnosis not present

## 2016-05-26 DIAGNOSIS — Z85828 Personal history of other malignant neoplasm of skin: Secondary | ICD-10-CM | POA: Diagnosis not present

## 2016-05-26 DIAGNOSIS — D1801 Hemangioma of skin and subcutaneous tissue: Secondary | ICD-10-CM | POA: Diagnosis not present

## 2016-05-26 DIAGNOSIS — L720 Epidermal cyst: Secondary | ICD-10-CM | POA: Diagnosis not present

## 2016-05-26 DIAGNOSIS — L570 Actinic keratosis: Secondary | ICD-10-CM | POA: Diagnosis not present

## 2016-05-26 DIAGNOSIS — M2022 Hallux rigidus, left foot: Secondary | ICD-10-CM | POA: Diagnosis not present

## 2016-05-26 DIAGNOSIS — Z4789 Encounter for other orthopedic aftercare: Secondary | ICD-10-CM | POA: Diagnosis not present

## 2016-06-11 DIAGNOSIS — Z4789 Encounter for other orthopedic aftercare: Secondary | ICD-10-CM | POA: Diagnosis not present

## 2016-07-09 DIAGNOSIS — Z4789 Encounter for other orthopedic aftercare: Secondary | ICD-10-CM | POA: Diagnosis not present

## 2016-07-29 DIAGNOSIS — Z85828 Personal history of other malignant neoplasm of skin: Secondary | ICD-10-CM | POA: Diagnosis not present

## 2016-07-29 DIAGNOSIS — L570 Actinic keratosis: Secondary | ICD-10-CM | POA: Diagnosis not present

## 2016-07-29 DIAGNOSIS — C44722 Squamous cell carcinoma of skin of right lower limb, including hip: Secondary | ICD-10-CM | POA: Diagnosis not present

## 2016-09-08 DIAGNOSIS — S4992XA Unspecified injury of left shoulder and upper arm, initial encounter: Secondary | ICD-10-CM | POA: Diagnosis not present

## 2016-09-08 DIAGNOSIS — S40012A Contusion of left shoulder, initial encounter: Secondary | ICD-10-CM | POA: Diagnosis not present

## 2016-09-08 DIAGNOSIS — M19012 Primary osteoarthritis, left shoulder: Secondary | ICD-10-CM | POA: Diagnosis not present

## 2016-09-16 DIAGNOSIS — H0230 Blepharochalasis unspecified eye, unspecified eyelid: Secondary | ICD-10-CM | POA: Diagnosis not present

## 2016-09-16 DIAGNOSIS — H02839 Dermatochalasis of unspecified eye, unspecified eyelid: Secondary | ICD-10-CM | POA: Diagnosis not present

## 2016-09-16 DIAGNOSIS — Z961 Presence of intraocular lens: Secondary | ICD-10-CM | POA: Diagnosis not present

## 2016-09-17 DIAGNOSIS — Z85820 Personal history of malignant melanoma of skin: Secondary | ICD-10-CM | POA: Diagnosis not present

## 2016-09-17 DIAGNOSIS — D225 Melanocytic nevi of trunk: Secondary | ICD-10-CM | POA: Diagnosis not present

## 2016-09-17 DIAGNOSIS — D1801 Hemangioma of skin and subcutaneous tissue: Secondary | ICD-10-CM | POA: Diagnosis not present

## 2016-09-17 DIAGNOSIS — L821 Other seborrheic keratosis: Secondary | ICD-10-CM | POA: Diagnosis not present

## 2016-09-17 DIAGNOSIS — Z85828 Personal history of other malignant neoplasm of skin: Secondary | ICD-10-CM | POA: Diagnosis not present

## 2016-09-24 DIAGNOSIS — M1711 Unilateral primary osteoarthritis, right knee: Secondary | ICD-10-CM | POA: Diagnosis not present

## 2016-10-27 DIAGNOSIS — I8392 Asymptomatic varicose veins of left lower extremity: Secondary | ICD-10-CM | POA: Diagnosis not present

## 2016-10-27 DIAGNOSIS — D485 Neoplasm of uncertain behavior of skin: Secondary | ICD-10-CM | POA: Diagnosis not present

## 2016-10-27 DIAGNOSIS — Z85828 Personal history of other malignant neoplasm of skin: Secondary | ICD-10-CM | POA: Diagnosis not present

## 2016-10-27 DIAGNOSIS — L570 Actinic keratosis: Secondary | ICD-10-CM | POA: Diagnosis not present

## 2016-10-27 DIAGNOSIS — L308 Other specified dermatitis: Secondary | ICD-10-CM | POA: Diagnosis not present

## 2016-11-03 DIAGNOSIS — M25512 Pain in left shoulder: Secondary | ICD-10-CM | POA: Diagnosis not present

## 2016-11-03 DIAGNOSIS — M7542 Impingement syndrome of left shoulder: Secondary | ICD-10-CM | POA: Diagnosis not present

## 2016-12-12 ENCOUNTER — Encounter: Payer: Self-pay | Admitting: Vascular Surgery

## 2016-12-29 ENCOUNTER — Encounter: Payer: Medicare Other | Admitting: Vascular Surgery

## 2017-01-06 DIAGNOSIS — L820 Inflamed seborrheic keratosis: Secondary | ICD-10-CM | POA: Diagnosis not present

## 2017-01-06 DIAGNOSIS — Z85828 Personal history of other malignant neoplasm of skin: Secondary | ICD-10-CM | POA: Diagnosis not present

## 2017-01-06 DIAGNOSIS — I8311 Varicose veins of right lower extremity with inflammation: Secondary | ICD-10-CM | POA: Diagnosis not present

## 2017-01-06 DIAGNOSIS — D485 Neoplasm of uncertain behavior of skin: Secondary | ICD-10-CM | POA: Diagnosis not present

## 2017-01-06 DIAGNOSIS — Z85820 Personal history of malignant melanoma of skin: Secondary | ICD-10-CM | POA: Diagnosis not present

## 2017-01-06 DIAGNOSIS — I872 Venous insufficiency (chronic) (peripheral): Secondary | ICD-10-CM | POA: Diagnosis not present

## 2017-01-06 DIAGNOSIS — L853 Xerosis cutis: Secondary | ICD-10-CM | POA: Diagnosis not present

## 2017-01-06 DIAGNOSIS — D692 Other nonthrombocytopenic purpura: Secondary | ICD-10-CM | POA: Diagnosis not present

## 2017-01-06 DIAGNOSIS — L821 Other seborrheic keratosis: Secondary | ICD-10-CM | POA: Diagnosis not present

## 2017-01-06 DIAGNOSIS — C44729 Squamous cell carcinoma of skin of left lower limb, including hip: Secondary | ICD-10-CM | POA: Diagnosis not present

## 2017-01-06 DIAGNOSIS — I8312 Varicose veins of left lower extremity with inflammation: Secondary | ICD-10-CM | POA: Diagnosis not present

## 2017-02-03 DIAGNOSIS — C44729 Squamous cell carcinoma of skin of left lower limb, including hip: Secondary | ICD-10-CM | POA: Diagnosis not present

## 2017-02-03 DIAGNOSIS — Z85828 Personal history of other malignant neoplasm of skin: Secondary | ICD-10-CM | POA: Diagnosis not present

## 2017-02-18 DIAGNOSIS — Z803 Family history of malignant neoplasm of breast: Secondary | ICD-10-CM | POA: Diagnosis not present

## 2017-02-18 DIAGNOSIS — Z1231 Encounter for screening mammogram for malignant neoplasm of breast: Secondary | ICD-10-CM | POA: Diagnosis not present

## 2017-02-18 DIAGNOSIS — M8589 Other specified disorders of bone density and structure, multiple sites: Secondary | ICD-10-CM | POA: Diagnosis not present

## 2017-02-24 DIAGNOSIS — Z85828 Personal history of other malignant neoplasm of skin: Secondary | ICD-10-CM | POA: Diagnosis not present

## 2017-02-24 DIAGNOSIS — R21 Rash and other nonspecific skin eruption: Secondary | ICD-10-CM | POA: Diagnosis not present

## 2017-02-25 DIAGNOSIS — M1711 Unilateral primary osteoarthritis, right knee: Secondary | ICD-10-CM | POA: Diagnosis not present

## 2017-03-06 DIAGNOSIS — J029 Acute pharyngitis, unspecified: Secondary | ICD-10-CM | POA: Diagnosis not present

## 2017-03-08 DIAGNOSIS — J029 Acute pharyngitis, unspecified: Secondary | ICD-10-CM | POA: Diagnosis not present

## 2017-03-30 DIAGNOSIS — M79672 Pain in left foot: Secondary | ICD-10-CM | POA: Diagnosis not present

## 2017-04-07 DIAGNOSIS — M79672 Pain in left foot: Secondary | ICD-10-CM | POA: Diagnosis not present

## 2017-04-30 DIAGNOSIS — M1711 Unilateral primary osteoarthritis, right knee: Secondary | ICD-10-CM | POA: Diagnosis not present

## 2017-05-11 DIAGNOSIS — R239 Unspecified skin changes: Secondary | ICD-10-CM | POA: Diagnosis not present

## 2017-05-11 DIAGNOSIS — R221 Localized swelling, mass and lump, neck: Secondary | ICD-10-CM | POA: Diagnosis not present

## 2017-05-13 ENCOUNTER — Other Ambulatory Visit: Payer: Self-pay | Admitting: Internal Medicine

## 2017-05-13 DIAGNOSIS — Z85820 Personal history of malignant melanoma of skin: Secondary | ICD-10-CM | POA: Diagnosis not present

## 2017-05-13 DIAGNOSIS — Z6824 Body mass index (BMI) 24.0-24.9, adult: Secondary | ICD-10-CM | POA: Diagnosis not present

## 2017-05-13 DIAGNOSIS — Z1389 Encounter for screening for other disorder: Secondary | ICD-10-CM | POA: Diagnosis not present

## 2017-05-13 DIAGNOSIS — R030 Elevated blood-pressure reading, without diagnosis of hypertension: Secondary | ICD-10-CM | POA: Diagnosis not present

## 2017-05-13 DIAGNOSIS — R221 Localized swelling, mass and lump, neck: Secondary | ICD-10-CM

## 2017-05-14 ENCOUNTER — Other Ambulatory Visit: Payer: Medicare Other

## 2017-05-27 ENCOUNTER — Ambulatory Visit
Admission: RE | Admit: 2017-05-27 | Discharge: 2017-05-27 | Disposition: A | Discharge status: Home / Self Care | Payer: Medicare Other | Source: Ambulatory Visit | Attending: Internal Medicine | Admitting: Internal Medicine

## 2017-05-27 DIAGNOSIS — G8929 Other chronic pain: Secondary | ICD-10-CM | POA: Diagnosis not present

## 2017-05-27 DIAGNOSIS — R221 Localized swelling, mass and lump, neck: Secondary | ICD-10-CM

## 2017-05-27 DIAGNOSIS — M25551 Pain in right hip: Secondary | ICD-10-CM | POA: Diagnosis not present

## 2017-05-27 DIAGNOSIS — M7061 Trochanteric bursitis, right hip: Secondary | ICD-10-CM | POA: Diagnosis not present

## 2017-05-28 DIAGNOSIS — C44722 Squamous cell carcinoma of skin of right lower limb, including hip: Secondary | ICD-10-CM | POA: Diagnosis not present

## 2017-05-28 DIAGNOSIS — L570 Actinic keratosis: Secondary | ICD-10-CM | POA: Diagnosis not present

## 2017-05-28 DIAGNOSIS — Z85828 Personal history of other malignant neoplasm of skin: Secondary | ICD-10-CM | POA: Diagnosis not present

## 2017-08-10 DIAGNOSIS — L300 Nummular dermatitis: Secondary | ICD-10-CM | POA: Diagnosis not present

## 2017-08-10 DIAGNOSIS — Z85828 Personal history of other malignant neoplasm of skin: Secondary | ICD-10-CM | POA: Diagnosis not present

## 2017-08-10 DIAGNOSIS — L245 Irritant contact dermatitis due to other chemical products: Secondary | ICD-10-CM | POA: Diagnosis not present

## 2017-09-03 DIAGNOSIS — Z0000 Encounter for general adult medical examination without abnormal findings: Secondary | ICD-10-CM | POA: Diagnosis not present

## 2017-09-03 DIAGNOSIS — E784 Other hyperlipidemia: Secondary | ICD-10-CM | POA: Diagnosis not present

## 2017-09-10 DIAGNOSIS — Z0000 Encounter for general adult medical examination without abnormal findings: Secondary | ICD-10-CM | POA: Diagnosis not present

## 2017-09-10 DIAGNOSIS — Z6825 Body mass index (BMI) 25.0-25.9, adult: Secondary | ICD-10-CM | POA: Diagnosis not present

## 2017-09-10 DIAGNOSIS — R030 Elevated blood-pressure reading, without diagnosis of hypertension: Secondary | ICD-10-CM | POA: Diagnosis not present

## 2017-09-10 DIAGNOSIS — E784 Other hyperlipidemia: Secondary | ICD-10-CM | POA: Diagnosis not present

## 2017-09-10 DIAGNOSIS — M25561 Pain in right knee: Secondary | ICD-10-CM | POA: Diagnosis not present

## 2017-09-10 DIAGNOSIS — Z23 Encounter for immunization: Secondary | ICD-10-CM | POA: Diagnosis not present

## 2017-09-10 DIAGNOSIS — Z85820 Personal history of malignant melanoma of skin: Secondary | ICD-10-CM | POA: Diagnosis not present

## 2017-09-10 DIAGNOSIS — R221 Localized swelling, mass and lump, neck: Secondary | ICD-10-CM | POA: Diagnosis not present

## 2017-09-10 DIAGNOSIS — Z1389 Encounter for screening for other disorder: Secondary | ICD-10-CM | POA: Diagnosis not present

## 2017-09-11 DIAGNOSIS — Z1212 Encounter for screening for malignant neoplasm of rectum: Secondary | ICD-10-CM | POA: Diagnosis not present

## 2017-09-15 DIAGNOSIS — D225 Melanocytic nevi of trunk: Secondary | ICD-10-CM | POA: Diagnosis not present

## 2017-09-15 DIAGNOSIS — D692 Other nonthrombocytopenic purpura: Secondary | ICD-10-CM | POA: Diagnosis not present

## 2017-09-15 DIAGNOSIS — L821 Other seborrheic keratosis: Secondary | ICD-10-CM | POA: Diagnosis not present

## 2017-09-15 DIAGNOSIS — D1801 Hemangioma of skin and subcutaneous tissue: Secondary | ICD-10-CM | POA: Diagnosis not present

## 2017-09-15 DIAGNOSIS — L570 Actinic keratosis: Secondary | ICD-10-CM | POA: Diagnosis not present

## 2017-09-15 DIAGNOSIS — Z85828 Personal history of other malignant neoplasm of skin: Secondary | ICD-10-CM | POA: Diagnosis not present

## 2017-09-15 DIAGNOSIS — C44729 Squamous cell carcinoma of skin of left lower limb, including hip: Secondary | ICD-10-CM | POA: Diagnosis not present

## 2017-09-24 ENCOUNTER — Encounter: Payer: Self-pay | Admitting: Internal Medicine

## 2017-09-25 DIAGNOSIS — L0889 Other specified local infections of the skin and subcutaneous tissue: Secondary | ICD-10-CM | POA: Diagnosis not present

## 2017-10-13 DIAGNOSIS — H0230 Blepharochalasis unspecified eye, unspecified eyelid: Secondary | ICD-10-CM | POA: Diagnosis not present

## 2017-10-13 DIAGNOSIS — Z961 Presence of intraocular lens: Secondary | ICD-10-CM | POA: Diagnosis not present

## 2017-10-13 DIAGNOSIS — H02839 Dermatochalasis of unspecified eye, unspecified eyelid: Secondary | ICD-10-CM | POA: Diagnosis not present

## 2017-10-13 DIAGNOSIS — H18413 Arcus senilis, bilateral: Secondary | ICD-10-CM | POA: Diagnosis not present

## 2017-11-23 DIAGNOSIS — M7061 Trochanteric bursitis, right hip: Secondary | ICD-10-CM | POA: Diagnosis not present

## 2017-11-26 ENCOUNTER — Encounter: Payer: Self-pay | Admitting: Internal Medicine

## 2017-11-27 ENCOUNTER — Encounter: Payer: Medicare Other | Admitting: Internal Medicine

## 2017-12-04 DIAGNOSIS — M7061 Trochanteric bursitis, right hip: Secondary | ICD-10-CM | POA: Diagnosis not present

## 2017-12-07 DIAGNOSIS — M7061 Trochanteric bursitis, right hip: Secondary | ICD-10-CM | POA: Diagnosis not present

## 2017-12-09 DIAGNOSIS — M7061 Trochanteric bursitis, right hip: Secondary | ICD-10-CM | POA: Diagnosis not present

## 2017-12-28 DIAGNOSIS — M7061 Trochanteric bursitis, right hip: Secondary | ICD-10-CM | POA: Diagnosis not present

## 2017-12-31 DIAGNOSIS — M7061 Trochanteric bursitis, right hip: Secondary | ICD-10-CM | POA: Diagnosis not present

## 2018-01-05 DIAGNOSIS — M7061 Trochanteric bursitis, right hip: Secondary | ICD-10-CM | POA: Diagnosis not present

## 2018-01-07 DIAGNOSIS — M7061 Trochanteric bursitis, right hip: Secondary | ICD-10-CM | POA: Diagnosis not present

## 2018-01-11 DIAGNOSIS — M7061 Trochanteric bursitis, right hip: Secondary | ICD-10-CM | POA: Diagnosis not present

## 2018-01-11 DIAGNOSIS — Z85828 Personal history of other malignant neoplasm of skin: Secondary | ICD-10-CM | POA: Diagnosis not present

## 2018-01-11 DIAGNOSIS — L570 Actinic keratosis: Secondary | ICD-10-CM | POA: Diagnosis not present

## 2018-01-13 DIAGNOSIS — M7061 Trochanteric bursitis, right hip: Secondary | ICD-10-CM | POA: Diagnosis not present

## 2018-01-20 DIAGNOSIS — M7061 Trochanteric bursitis, right hip: Secondary | ICD-10-CM | POA: Diagnosis not present

## 2018-02-09 DIAGNOSIS — M7061 Trochanteric bursitis, right hip: Secondary | ICD-10-CM | POA: Diagnosis not present

## 2018-03-16 DIAGNOSIS — L570 Actinic keratosis: Secondary | ICD-10-CM | POA: Diagnosis not present

## 2018-03-16 DIAGNOSIS — Z85828 Personal history of other malignant neoplasm of skin: Secondary | ICD-10-CM | POA: Diagnosis not present

## 2018-03-16 DIAGNOSIS — D225 Melanocytic nevi of trunk: Secondary | ICD-10-CM | POA: Diagnosis not present

## 2018-03-16 DIAGNOSIS — L821 Other seborrheic keratosis: Secondary | ICD-10-CM | POA: Diagnosis not present

## 2018-03-16 DIAGNOSIS — D485 Neoplasm of uncertain behavior of skin: Secondary | ICD-10-CM | POA: Diagnosis not present

## 2018-03-16 DIAGNOSIS — D1801 Hemangioma of skin and subcutaneous tissue: Secondary | ICD-10-CM | POA: Diagnosis not present

## 2018-03-16 DIAGNOSIS — L853 Xerosis cutis: Secondary | ICD-10-CM | POA: Diagnosis not present

## 2018-03-16 DIAGNOSIS — D692 Other nonthrombocytopenic purpura: Secondary | ICD-10-CM | POA: Diagnosis not present

## 2018-06-11 DIAGNOSIS — M1711 Unilateral primary osteoarthritis, right knee: Secondary | ICD-10-CM | POA: Diagnosis not present

## 2018-09-02 DIAGNOSIS — M1711 Unilateral primary osteoarthritis, right knee: Secondary | ICD-10-CM | POA: Diagnosis not present

## 2018-09-13 DIAGNOSIS — M7061 Trochanteric bursitis, right hip: Secondary | ICD-10-CM | POA: Diagnosis not present

## 2018-09-14 DIAGNOSIS — L821 Other seborrheic keratosis: Secondary | ICD-10-CM | POA: Diagnosis not present

## 2018-09-14 DIAGNOSIS — D1801 Hemangioma of skin and subcutaneous tissue: Secondary | ICD-10-CM | POA: Diagnosis not present

## 2018-09-14 DIAGNOSIS — Z85828 Personal history of other malignant neoplasm of skin: Secondary | ICD-10-CM | POA: Diagnosis not present

## 2018-09-14 DIAGNOSIS — D225 Melanocytic nevi of trunk: Secondary | ICD-10-CM | POA: Diagnosis not present

## 2018-09-14 DIAGNOSIS — I788 Other diseases of capillaries: Secondary | ICD-10-CM | POA: Diagnosis not present

## 2018-09-15 DIAGNOSIS — R82998 Other abnormal findings in urine: Secondary | ICD-10-CM | POA: Diagnosis not present

## 2018-09-15 DIAGNOSIS — E7849 Other hyperlipidemia: Secondary | ICD-10-CM | POA: Diagnosis not present

## 2018-09-15 DIAGNOSIS — M859 Disorder of bone density and structure, unspecified: Secondary | ICD-10-CM | POA: Diagnosis not present

## 2018-09-22 DIAGNOSIS — Z1389 Encounter for screening for other disorder: Secondary | ICD-10-CM | POA: Diagnosis not present

## 2018-09-22 DIAGNOSIS — M859 Disorder of bone density and structure, unspecified: Secondary | ICD-10-CM | POA: Diagnosis not present

## 2018-09-22 DIAGNOSIS — R030 Elevated blood-pressure reading, without diagnosis of hypertension: Secondary | ICD-10-CM | POA: Diagnosis not present

## 2018-09-22 DIAGNOSIS — Z85820 Personal history of malignant melanoma of skin: Secondary | ICD-10-CM | POA: Diagnosis not present

## 2018-09-22 DIAGNOSIS — E7849 Other hyperlipidemia: Secondary | ICD-10-CM | POA: Diagnosis not present

## 2018-09-22 DIAGNOSIS — M25561 Pain in right knee: Secondary | ICD-10-CM | POA: Diagnosis not present

## 2018-09-22 DIAGNOSIS — M25551 Pain in right hip: Secondary | ICD-10-CM | POA: Diagnosis not present

## 2018-09-22 DIAGNOSIS — Z6824 Body mass index (BMI) 24.0-24.9, adult: Secondary | ICD-10-CM | POA: Diagnosis not present

## 2018-09-22 DIAGNOSIS — Z0000 Encounter for general adult medical examination without abnormal findings: Secondary | ICD-10-CM | POA: Diagnosis not present

## 2018-09-22 DIAGNOSIS — Z23 Encounter for immunization: Secondary | ICD-10-CM | POA: Diagnosis not present

## 2018-09-27 DIAGNOSIS — Z1231 Encounter for screening mammogram for malignant neoplasm of breast: Secondary | ICD-10-CM | POA: Diagnosis not present

## 2018-10-05 DIAGNOSIS — Z1212 Encounter for screening for malignant neoplasm of rectum: Secondary | ICD-10-CM | POA: Diagnosis not present

## 2018-10-15 DIAGNOSIS — M25561 Pain in right knee: Secondary | ICD-10-CM | POA: Diagnosis not present

## 2018-10-15 DIAGNOSIS — M1711 Unilateral primary osteoarthritis, right knee: Secondary | ICD-10-CM | POA: Diagnosis not present

## 2018-11-02 DIAGNOSIS — Z961 Presence of intraocular lens: Secondary | ICD-10-CM | POA: Diagnosis not present

## 2018-11-02 DIAGNOSIS — H18413 Arcus senilis, bilateral: Secondary | ICD-10-CM | POA: Diagnosis not present

## 2018-11-02 DIAGNOSIS — H02831 Dermatochalasis of right upper eyelid: Secondary | ICD-10-CM | POA: Diagnosis not present

## 2018-11-02 DIAGNOSIS — H26491 Other secondary cataract, right eye: Secondary | ICD-10-CM | POA: Diagnosis not present

## 2019-03-10 DIAGNOSIS — Z85828 Personal history of other malignant neoplasm of skin: Secondary | ICD-10-CM | POA: Diagnosis not present

## 2019-03-10 DIAGNOSIS — L820 Inflamed seborrheic keratosis: Secondary | ICD-10-CM | POA: Diagnosis not present

## 2019-07-07 DIAGNOSIS — M25561 Pain in right knee: Secondary | ICD-10-CM | POA: Diagnosis not present

## 2019-07-07 DIAGNOSIS — M1711 Unilateral primary osteoarthritis, right knee: Secondary | ICD-10-CM | POA: Diagnosis not present

## 2019-09-16 DIAGNOSIS — L820 Inflamed seborrheic keratosis: Secondary | ICD-10-CM | POA: Diagnosis not present

## 2019-09-16 DIAGNOSIS — D485 Neoplasm of uncertain behavior of skin: Secondary | ICD-10-CM | POA: Diagnosis not present

## 2019-09-16 DIAGNOSIS — L570 Actinic keratosis: Secondary | ICD-10-CM | POA: Diagnosis not present

## 2019-09-16 DIAGNOSIS — Z85828 Personal history of other malignant neoplasm of skin: Secondary | ICD-10-CM | POA: Diagnosis not present

## 2019-09-16 DIAGNOSIS — L821 Other seborrheic keratosis: Secondary | ICD-10-CM | POA: Diagnosis not present

## 2019-09-16 DIAGNOSIS — D225 Melanocytic nevi of trunk: Secondary | ICD-10-CM | POA: Diagnosis not present

## 2019-09-16 DIAGNOSIS — D1801 Hemangioma of skin and subcutaneous tissue: Secondary | ICD-10-CM | POA: Diagnosis not present

## 2019-09-16 DIAGNOSIS — C44729 Squamous cell carcinoma of skin of left lower limb, including hip: Secondary | ICD-10-CM | POA: Diagnosis not present

## 2019-09-20 DIAGNOSIS — H18413 Arcus senilis, bilateral: Secondary | ICD-10-CM | POA: Diagnosis not present

## 2019-09-20 DIAGNOSIS — H02831 Dermatochalasis of right upper eyelid: Secondary | ICD-10-CM | POA: Diagnosis not present

## 2019-09-20 DIAGNOSIS — Z961 Presence of intraocular lens: Secondary | ICD-10-CM | POA: Diagnosis not present

## 2019-09-20 DIAGNOSIS — H02834 Dermatochalasis of left upper eyelid: Secondary | ICD-10-CM | POA: Diagnosis not present

## 2019-09-21 DIAGNOSIS — E7849 Other hyperlipidemia: Secondary | ICD-10-CM | POA: Diagnosis not present

## 2019-09-21 DIAGNOSIS — R82998 Other abnormal findings in urine: Secondary | ICD-10-CM | POA: Diagnosis not present

## 2019-09-21 DIAGNOSIS — M859 Disorder of bone density and structure, unspecified: Secondary | ICD-10-CM | POA: Diagnosis not present

## 2019-09-21 DIAGNOSIS — M8580 Other specified disorders of bone density and structure, unspecified site: Secondary | ICD-10-CM | POA: Diagnosis not present

## 2019-09-28 DIAGNOSIS — Z0000 Encounter for general adult medical examination without abnormal findings: Secondary | ICD-10-CM | POA: Diagnosis not present

## 2019-09-28 DIAGNOSIS — Z1331 Encounter for screening for depression: Secondary | ICD-10-CM | POA: Diagnosis not present

## 2019-09-28 DIAGNOSIS — E785 Hyperlipidemia, unspecified: Secondary | ICD-10-CM | POA: Diagnosis not present

## 2019-09-28 DIAGNOSIS — R030 Elevated blood-pressure reading, without diagnosis of hypertension: Secondary | ICD-10-CM | POA: Diagnosis not present

## 2019-09-28 DIAGNOSIS — M8580 Other specified disorders of bone density and structure, unspecified site: Secondary | ICD-10-CM | POA: Diagnosis not present

## 2019-09-28 DIAGNOSIS — Z85820 Personal history of malignant melanoma of skin: Secondary | ICD-10-CM | POA: Diagnosis not present

## 2019-09-28 DIAGNOSIS — Z1339 Encounter for screening examination for other mental health and behavioral disorders: Secondary | ICD-10-CM | POA: Diagnosis not present

## 2019-09-28 DIAGNOSIS — M25561 Pain in right knee: Secondary | ICD-10-CM | POA: Diagnosis not present

## 2019-09-30 ENCOUNTER — Encounter: Payer: Self-pay | Admitting: Gastroenterology

## 2019-10-03 DIAGNOSIS — Z1212 Encounter for screening for malignant neoplasm of rectum: Secondary | ICD-10-CM | POA: Diagnosis not present

## 2019-10-04 ENCOUNTER — Encounter: Payer: Self-pay | Admitting: Internal Medicine

## 2019-10-21 DIAGNOSIS — Z23 Encounter for immunization: Secondary | ICD-10-CM | POA: Diagnosis not present

## 2019-10-26 ENCOUNTER — Other Ambulatory Visit: Payer: Self-pay

## 2019-10-26 ENCOUNTER — Ambulatory Visit (AMBULATORY_SURGERY_CENTER): Payer: Medicare Other | Admitting: *Deleted

## 2019-10-26 VITALS — Temp 96.6°F | Ht 64.0 in | Wt 136.0 lb

## 2019-10-26 DIAGNOSIS — Z1211 Encounter for screening for malignant neoplasm of colon: Secondary | ICD-10-CM

## 2019-10-26 DIAGNOSIS — Z1159 Encounter for screening for other viral diseases: Secondary | ICD-10-CM

## 2019-10-26 MED ORDER — SUPREP BOWEL PREP KIT 17.5-3.13-1.6 GM/177ML PO SOLN: 1.0000 | Freq: Once | ORAL | 0 refills | Status: AC

## 2019-10-26 NOTE — Progress Notes (Signed)
No egg or soy allergy known to patient   issues with past sedation with any surgeries  or procedures- Hx of PONV , no intubation problems  No diet pills per patient No home 02 use per patient  No blood thinners per patient  Pt denies issues with constipation  No A fib or A flutter  EMMI video sent to pt's e mail   Due to the COVID-19 pandemic we are asking patients to follow these guidelines. Please only bring one care partner. Please be aware that your care partner may wait in the car in the parking lot or if they feel like they will be too hot to wait in the car, they may wait in the lobby on the 4th floor. All care partners are required to wear a mask the entire time (we do not have any that we can provide them), they need to practice social distancing, and we will do a Covid check for all patient's and care partners when you arrive. Also we will check their temperature and your temperature. If the care partner waits in their car they need to stay in the parking lot the entire time and we will call them on their cell phone when the patient is ready for discharge so they can bring the car to the front of the building. Also all patient's will need to wear a mask into building.  covid test 11-12 Thursday at 1140 am

## 2019-11-01 ENCOUNTER — Encounter: Payer: Medicare Other | Admitting: Gastroenterology

## 2019-11-03 ENCOUNTER — Other Ambulatory Visit: Payer: Self-pay | Admitting: Internal Medicine

## 2019-11-03 ENCOUNTER — Ambulatory Visit (INDEPENDENT_AMBULATORY_CARE_PROVIDER_SITE_OTHER): Payer: Medicare Other

## 2019-11-03 DIAGNOSIS — Z1159 Encounter for screening for other viral diseases: Secondary | ICD-10-CM

## 2019-11-04 LAB — SARS CORONAVIRUS 2 (TAT 6-24 HRS): SARS Coronavirus 2: NEGATIVE

## 2019-11-08 ENCOUNTER — Ambulatory Visit (AMBULATORY_SURGERY_CENTER): Payer: Medicare Other | Admitting: Internal Medicine

## 2019-11-08 ENCOUNTER — Other Ambulatory Visit: Payer: Self-pay

## 2019-11-08 ENCOUNTER — Encounter: Payer: Self-pay | Admitting: Internal Medicine

## 2019-11-08 VITALS — BP 145/69 | HR 63 | Temp 98.0°F | Resp 13 | Ht 64.0 in | Wt 136.0 lb

## 2019-11-08 DIAGNOSIS — Z1211 Encounter for screening for malignant neoplasm of colon: Secondary | ICD-10-CM

## 2019-11-08 DIAGNOSIS — D122 Benign neoplasm of ascending colon: Secondary | ICD-10-CM

## 2019-11-08 MED ORDER — SODIUM CHLORIDE 0.9 % IV SOLN: 500.0000 mL | Freq: Once | INTRAVENOUS | Status: DC

## 2019-11-08 NOTE — Op Note (Signed)
[facility] Patient Name: Cassandra Underwood Procedure Date: 11/08/2019 1:15 PM MRN: [id number] Endoscopist: Docia Chuck. Henrene Pastor , MD Age: 78 Referring MD:  Date of Birth: 1941/07/26 Gender: Female Account #: [id number] Procedure:                Colonoscopy with with cold snare polypectomy x 1 Indications:              Screening for colorectal malignant neoplasm.                            Previous examination November 2004 was negative for                            neoplasia Medicines:                Monitored Anesthesia Care Procedure:                Pre-Anesthesia Assessment:                           - Prior to the procedure, a History and Physical                            was performed, and patient medications and                            allergies were reviewed. The patient's tolerance of                            previous anesthesia was also reviewed. The risks                            and benefits of the procedure and the sedation                            options and risks were discussed with the patient.                            All questions were answered, and informed consent                            was obtained. Prior Anticoagulants: The patient has                            taken no previous anticoagulant or antiplatelet                            agents. ASA Grade Assessment: II - A patient with                            mild systemic disease. After reviewing the risks                            and benefits, the patient was deemed in  satisfactory condition to undergo the procedure.                           After obtaining informed consent, the colonoscope                            was passed under direct vision. Throughout the                            procedure, the patient's blood pressure, pulse, and                            oxygen saturations were monitored continuously. The                            Colonoscope was  introduced through the anus and                            advanced to the the cecum, identified by                            appendiceal orifice and ileocecal valve. The                            ileocecal valve, appendiceal orifice, and rectum                            were photographed. The quality of the bowel                            preparation was excellent. The colonoscopy was                            performed without difficulty. The patient tolerated                            the procedure well. The bowel preparation used was                            SUPREP via split dose instruction. Scope In: 1:24:14 PM Scope Out: 1:44:26 PM Scope Withdrawal Time: 0 hours 14 minutes 3 seconds  Total Procedure Duration: 0 hours 20 minutes 12 seconds  Findings:                 A 4 mm polyp was found in the ascending colon. The                            polyp was removed with a cold snare. Resection and                            retrieval were complete.                           Multiple diverticula were found in the sigmoid  colon.                           Internal hemorrhoids were found during retroflexion.                           The exam was otherwise without abnormality on                            direct and retroflexion views. Complications:            No immediate complications. Estimated blood loss:                            None. Estimated Blood Loss:     Estimated blood loss: none. Impression:               - One 4 mm polyp in the ascending colon, removed                            with a cold snare. Resected and retrieved.                           - Diverticulosis in the sigmoid colon.                           - Internal hemorrhoids.                           - The examination was otherwise normal on direct                            and retroflexion views. Recommendation:           - Repeat colonoscopy is not recommended for                             surveillance.                           - Patient has a contact number available for                            emergencies. The signs and symptoms of potential                            delayed complications were discussed with the                            patient. Return to normal activities tomorrow.                            Written discharge instructions were provided to the                            patient.                           -  Resume previous diet.                           - Continue present medications.                           - Await pathology results. Docia Chuck. Henrene Pastor, MD 11/08/2019 1:53:55 PM This report has been signed electronically.

## 2019-11-08 NOTE — Progress Notes (Signed)
Called to room to assist during endoscopic procedure.  Patient ID and intended procedure confirmed with present staff. Received instructions for my participation in the procedure from the performing physician.  

## 2019-11-08 NOTE — Patient Instructions (Signed)
Information on polyps, diverticulosis and hemorrhoids given to you today.  Await pathology results.  YOU HAD AN ENDOSCOPIC PROCEDURE TODAY AT THE [facility]:   Refer to the procedure report that was given to you for any specific questions about what was found during the examination.  If the procedure report does not answer your questions, please call your gastroenterologist to clarify.  If you requested that your care partner not be given the details of your procedure findings, then the procedure report has been included in a sealed envelope for you to review at your convenience later.  YOU SHOULD EXPECT: Some feelings of bloating in the abdomen. Passage of more gas than usual.  Walking can help get rid of the air that was put into your GI tract during the procedure and reduce the bloating. If you had a lower endoscopy (such as a colonoscopy or flexible sigmoidoscopy) you may notice spotting of blood in your stool or on the toilet paper. If you underwent a bowel prep for your procedure, you may not have a normal bowel movement for a few days.  Please Note:  You might notice some irritation and congestion in your nose or some drainage.  This is from the oxygen used during your procedure.  There is no need for concern and it should clear up in a day or so.  SYMPTOMS TO REPORT IMMEDIATELY:   Following lower endoscopy (colonoscopy or flexible sigmoidoscopy):  Excessive amounts of blood in the stool  Significant tenderness or worsening of abdominal pains  Swelling of the abdomen that is new, acute  Fever of 100F or higher   For urgent or emergent issues, a gastroenterologist can be reached at any hour by calling [phone].   DIET:  We do recommend a small meal at first, but then you may proceed to your regular diet.  Drink plenty of fluids but you should avoid alcoholic beverages for 24 hours.  ACTIVITY:  You should plan to take it easy for the rest of today and you should NOT  DRIVE or use heavy machinery until tomorrow (because of the sedation medicines used during the test).    FOLLOW UP: Our staff will call the number listed on your records 48-72 hours following your procedure to check on you and address any questions or concerns that you may have regarding the information given to you following your procedure. If we do not reach you, we will leave a message.  We will attempt to reach you two times.  During this call, we will ask if you have developed any symptoms of COVID 19. If you develop any symptoms (ie: fever, flu-like symptoms, shortness of breath, cough etc.) before then, please call [phone].  If you test positive for Covid 19 in the 2 weeks post procedure, please call and report this information to us.    If any biopsies were taken you will be contacted by phone or by letter within the next 1-3 weeks.  Please call us at [phone] if you have not heard about the biopsies in 3 weeks.    SIGNATURES/CONFIDENTIALITY: You and/or your care partner have signed paperwork which will be entered into your electronic medical record.  These signatures attest to the fact that that the information above on your After Visit Summary has been reviewed and is understood.  Full responsibility of the confidentiality of this discharge information lies with you and/or your care-partner. 

## 2019-11-08 NOTE — Progress Notes (Signed)
A/ox3, pleased with MAC, report to RN 

## 2019-11-10 ENCOUNTER — Telehealth: Payer: Self-pay

## 2019-11-10 NOTE — Telephone Encounter (Signed)
  Follow up Call-  Call back number 11/08/2019  Post procedure Call Back phone  # [phone]  Permission to leave phone message Yes  Some recent data might be hidden     Patient questions:  Do you have a fever, pain , or abdominal swelling? No. Pain Score  0 *  Have you tolerated food without any problems? Yes.    Have you been able to return to your normal activities? Yes.    Do you have any questions about your discharge instructions: Diet   No. Medications  No. Follow up visit  No.  Do you have questions or concerns about your Care? No.  Actions: * If pain score is 4 or above: No action needed, pain <4.  1. Have you developed a fever since your procedure? no  2.   Have you had an respiratory symptoms (SOB or cough) since your procedure? no  3.   Have you tested positive for COVID 19 since your procedure? no  4.   Have you had any family members/close contacts diagnosed with the COVID 19 since your procedure?  no   If yes to any of these questions please route to Joylene John, RN and Alphonsa Gin, Therapist, sports.

## 2019-11-12 ENCOUNTER — Encounter: Payer: Self-pay | Admitting: Internal Medicine

## 2020-01-08 ENCOUNTER — Ambulatory Visit: Payer: Medicare Other | Attending: Internal Medicine

## 2020-01-08 DIAGNOSIS — Z23 Encounter for immunization: Secondary | ICD-10-CM

## 2020-01-08 NOTE — Progress Notes (Signed)
   Covid-19 Vaccination Clinic  Name:  Cassandra Underwood    MRN: [id number] DOB: 12/25/1940  01/08/2020  Cassandra Underwood was observed post Covid-19 immunization for 15 minutes without incidence. She was provided with Vaccine Information Sheet and instruction to access the V-Safe system.   Cassandra Underwood was instructed to call 911 with any severe reactions post vaccine: Marland Kitchen Difficulty breathing  . Swelling of your face and throat  . A fast heartbeat  . A bad rash all over your body  . Dizziness and weakness

## 2020-01-18 DIAGNOSIS — M25561 Pain in right knee: Secondary | ICD-10-CM | POA: Diagnosis not present

## 2020-01-18 DIAGNOSIS — M1711 Unilateral primary osteoarthritis, right knee: Secondary | ICD-10-CM | POA: Diagnosis not present

## 2020-01-26 ENCOUNTER — Ambulatory Visit: Payer: Medicare Other | Attending: Internal Medicine

## 2020-01-26 DIAGNOSIS — Z23 Encounter for immunization: Secondary | ICD-10-CM | POA: Insufficient documentation

## 2020-01-26 NOTE — Progress Notes (Signed)
   Covid-19 Vaccination Clinic  Name:  Cassandra Underwood    MRN: [id number] DOB: [birth year]  01/26/2020  [name] was observed post Covid-19 immunization for 15 minutes without incidence. She was provided with Vaccine Information Sheet and instruction to access the V-Safe system.   [name] was instructed to call 911 with any severe reactions post vaccine: Marland Kitchen Difficulty breathing  . Swelling of your face and throat  . A fast heartbeat  . A bad rash all over your body  . Dizziness and weakness    Immunizations Administered    Name Date Dose VIS Date Route   Pfizer COVID-19 Vaccine 01/26/2020  9:21 AM 0.3 mL 12/02/2019 Intramuscular   Manufacturer: [place]   Lot: [id number]   [place]: [id number]

## 2020-03-28 DIAGNOSIS — M79672 Pain in left foot: Secondary | ICD-10-CM | POA: Diagnosis not present

## 2020-04-04 DIAGNOSIS — L812 Freckles: Secondary | ICD-10-CM | POA: Diagnosis not present

## 2020-04-04 DIAGNOSIS — D1801 Hemangioma of skin and subcutaneous tissue: Secondary | ICD-10-CM | POA: Diagnosis not present

## 2020-04-04 DIAGNOSIS — Z85828 Personal history of other malignant neoplasm of skin: Secondary | ICD-10-CM | POA: Diagnosis not present

## 2020-04-04 DIAGNOSIS — L570 Actinic keratosis: Secondary | ICD-10-CM | POA: Diagnosis not present

## 2020-04-04 DIAGNOSIS — D225 Melanocytic nevi of trunk: Secondary | ICD-10-CM | POA: Diagnosis not present

## 2020-04-04 DIAGNOSIS — D692 Other nonthrombocytopenic purpura: Secondary | ICD-10-CM | POA: Diagnosis not present

## 2020-04-04 DIAGNOSIS — L821 Other seborrheic keratosis: Secondary | ICD-10-CM | POA: Diagnosis not present

## 2020-04-16 DIAGNOSIS — Z1231 Encounter for screening mammogram for malignant neoplasm of breast: Secondary | ICD-10-CM | POA: Diagnosis not present

## 2020-04-16 DIAGNOSIS — M8589 Other specified disorders of bone density and structure, multiple sites: Secondary | ICD-10-CM | POA: Diagnosis not present

## 2020-05-02 DIAGNOSIS — L820 Inflamed seborrheic keratosis: Secondary | ICD-10-CM | POA: Diagnosis not present

## 2020-05-02 DIAGNOSIS — Z85828 Personal history of other malignant neoplasm of skin: Secondary | ICD-10-CM | POA: Diagnosis not present

## 2020-05-02 DIAGNOSIS — S80811A Abrasion, right lower leg, initial encounter: Secondary | ICD-10-CM | POA: Diagnosis not present

## 2020-05-02 DIAGNOSIS — L570 Actinic keratosis: Secondary | ICD-10-CM | POA: Diagnosis not present

## 2020-09-13 DIAGNOSIS — Z23 Encounter for immunization: Secondary | ICD-10-CM | POA: Diagnosis not present

## 2020-10-16 DIAGNOSIS — M1711 Unilateral primary osteoarthritis, right knee: Secondary | ICD-10-CM | POA: Diagnosis not present

## 2020-11-01 DIAGNOSIS — H02834 Dermatochalasis of left upper eyelid: Secondary | ICD-10-CM | POA: Diagnosis not present

## 2020-11-01 DIAGNOSIS — H02831 Dermatochalasis of right upper eyelid: Secondary | ICD-10-CM | POA: Diagnosis not present

## 2020-11-01 DIAGNOSIS — Z961 Presence of intraocular lens: Secondary | ICD-10-CM | POA: Diagnosis not present

## 2020-11-01 DIAGNOSIS — H18413 Arcus senilis, bilateral: Secondary | ICD-10-CM | POA: Diagnosis not present

## 2020-11-02 DIAGNOSIS — L821 Other seborrheic keratosis: Secondary | ICD-10-CM | POA: Diagnosis not present

## 2020-11-02 DIAGNOSIS — D225 Melanocytic nevi of trunk: Secondary | ICD-10-CM | POA: Diagnosis not present

## 2020-11-02 DIAGNOSIS — D1801 Hemangioma of skin and subcutaneous tissue: Secondary | ICD-10-CM | POA: Diagnosis not present

## 2020-11-02 DIAGNOSIS — L738 Other specified follicular disorders: Secondary | ICD-10-CM | POA: Diagnosis not present

## 2020-11-02 DIAGNOSIS — L814 Other melanin hyperpigmentation: Secondary | ICD-10-CM | POA: Diagnosis not present

## 2020-11-02 DIAGNOSIS — Z85828 Personal history of other malignant neoplasm of skin: Secondary | ICD-10-CM | POA: Diagnosis not present

## 2020-11-02 DIAGNOSIS — L570 Actinic keratosis: Secondary | ICD-10-CM | POA: Diagnosis not present

## 2020-11-02 DIAGNOSIS — D692 Other nonthrombocytopenic purpura: Secondary | ICD-10-CM | POA: Diagnosis not present

## 2020-12-12 DIAGNOSIS — M76811 Anterior tibial syndrome, right leg: Secondary | ICD-10-CM | POA: Diagnosis not present

## 2020-12-12 DIAGNOSIS — M79671 Pain in right foot: Secondary | ICD-10-CM | POA: Diagnosis not present

## 2020-12-31 DIAGNOSIS — M79671 Pain in right foot: Secondary | ICD-10-CM | POA: Diagnosis not present

## 2021-01-11 DIAGNOSIS — M76811 Anterior tibial syndrome, right leg: Secondary | ICD-10-CM | POA: Diagnosis not present

## 2021-01-11 DIAGNOSIS — M19071 Primary osteoarthritis, right ankle and foot: Secondary | ICD-10-CM | POA: Diagnosis not present

## 2021-01-15 DIAGNOSIS — I8312 Varicose veins of left lower extremity with inflammation: Secondary | ICD-10-CM | POA: Diagnosis not present

## 2021-01-15 DIAGNOSIS — I8311 Varicose veins of right lower extremity with inflammation: Secondary | ICD-10-CM | POA: Diagnosis not present

## 2021-01-15 DIAGNOSIS — L570 Actinic keratosis: Secondary | ICD-10-CM | POA: Diagnosis not present

## 2021-01-15 DIAGNOSIS — Z85828 Personal history of other malignant neoplasm of skin: Secondary | ICD-10-CM | POA: Diagnosis not present

## 2021-01-15 DIAGNOSIS — I872 Venous insufficiency (chronic) (peripheral): Secondary | ICD-10-CM | POA: Diagnosis not present

## 2021-01-15 DIAGNOSIS — D1801 Hemangioma of skin and subcutaneous tissue: Secondary | ICD-10-CM | POA: Diagnosis not present

## 2021-03-04 DIAGNOSIS — E785 Hyperlipidemia, unspecified: Secondary | ICD-10-CM | POA: Diagnosis not present

## 2021-03-04 DIAGNOSIS — M859 Disorder of bone density and structure, unspecified: Secondary | ICD-10-CM | POA: Diagnosis not present

## 2021-03-08 DIAGNOSIS — R030 Elevated blood-pressure reading, without diagnosis of hypertension: Secondary | ICD-10-CM | POA: Diagnosis not present

## 2021-03-08 DIAGNOSIS — Z1212 Encounter for screening for malignant neoplasm of rectum: Secondary | ICD-10-CM | POA: Diagnosis not present

## 2021-03-08 DIAGNOSIS — M25551 Pain in right hip: Secondary | ICD-10-CM | POA: Diagnosis not present

## 2021-03-08 DIAGNOSIS — Z0000 Encounter for general adult medical examination without abnormal findings: Secondary | ICD-10-CM | POA: Diagnosis not present

## 2021-03-08 DIAGNOSIS — M25561 Pain in right knee: Secondary | ICD-10-CM | POA: Diagnosis not present

## 2021-03-08 DIAGNOSIS — R82998 Other abnormal findings in urine: Secondary | ICD-10-CM | POA: Diagnosis not present

## 2021-03-08 DIAGNOSIS — F39 Unspecified mood [affective] disorder: Secondary | ICD-10-CM | POA: Diagnosis not present

## 2021-03-08 DIAGNOSIS — E785 Hyperlipidemia, unspecified: Secondary | ICD-10-CM | POA: Diagnosis not present

## 2021-03-08 DIAGNOSIS — Z85820 Personal history of malignant melanoma of skin: Secondary | ICD-10-CM | POA: Diagnosis not present

## 2021-03-08 DIAGNOSIS — Z1331 Encounter for screening for depression: Secondary | ICD-10-CM | POA: Diagnosis not present

## 2021-03-08 DIAGNOSIS — Z1339 Encounter for screening examination for other mental health and behavioral disorders: Secondary | ICD-10-CM | POA: Diagnosis not present

## 2021-03-08 DIAGNOSIS — M8580 Other specified disorders of bone density and structure, unspecified site: Secondary | ICD-10-CM | POA: Diagnosis not present

## 2021-03-11 ENCOUNTER — Other Ambulatory Visit: Payer: Self-pay | Admitting: Internal Medicine

## 2021-03-11 DIAGNOSIS — E785 Hyperlipidemia, unspecified: Secondary | ICD-10-CM

## 2021-03-27 ENCOUNTER — Other Ambulatory Visit: Payer: Medicare Other

## 2021-03-28 ENCOUNTER — Other Ambulatory Visit: Payer: Medicare Other

## 2021-04-03 DIAGNOSIS — M1711 Unilateral primary osteoarthritis, right knee: Secondary | ICD-10-CM | POA: Diagnosis not present

## 2021-04-15 ENCOUNTER — Other Ambulatory Visit: Payer: Medicare Other

## 2021-04-29 DIAGNOSIS — Z1231 Encounter for screening mammogram for malignant neoplasm of breast: Secondary | ICD-10-CM | POA: Diagnosis not present

## 2021-04-30 ENCOUNTER — Ambulatory Visit
Admission: RE | Admit: 2021-04-30 | Discharge: 2021-04-30 | Disposition: A | Discharge status: Home / Self Care | Payer: No Typology Code available for payment source | Source: Ambulatory Visit | Attending: Internal Medicine | Admitting: Internal Medicine

## 2021-04-30 ENCOUNTER — Other Ambulatory Visit: Payer: Medicare Other

## 2021-04-30 DIAGNOSIS — E785 Hyperlipidemia, unspecified: Secondary | ICD-10-CM

## 2021-04-30 DIAGNOSIS — I700 Atherosclerosis of aorta: Secondary | ICD-10-CM | POA: Diagnosis not present

## 2021-05-01 DIAGNOSIS — R059 Cough, unspecified: Secondary | ICD-10-CM | POA: Diagnosis not present

## 2021-05-01 DIAGNOSIS — R509 Fever, unspecified: Secondary | ICD-10-CM | POA: Diagnosis not present

## 2021-05-01 DIAGNOSIS — Z1152 Encounter for screening for COVID-19: Secondary | ICD-10-CM | POA: Diagnosis not present

## 2021-05-03 DIAGNOSIS — J020 Streptococcal pharyngitis: Secondary | ICD-10-CM | POA: Diagnosis not present

## 2021-05-03 DIAGNOSIS — J029 Acute pharyngitis, unspecified: Secondary | ICD-10-CM | POA: Diagnosis not present

## 2021-05-13 DIAGNOSIS — R928 Other abnormal and inconclusive findings on diagnostic imaging of breast: Secondary | ICD-10-CM | POA: Diagnosis not present

## 2021-05-28 DIAGNOSIS — N6002 Solitary cyst of left breast: Secondary | ICD-10-CM | POA: Diagnosis not present

## 2021-06-18 DIAGNOSIS — D1801 Hemangioma of skin and subcutaneous tissue: Secondary | ICD-10-CM | POA: Diagnosis not present

## 2021-06-18 DIAGNOSIS — L98499 Non-pressure chronic ulcer of skin of other sites with unspecified severity: Secondary | ICD-10-CM | POA: Diagnosis not present

## 2021-06-18 DIAGNOSIS — Z85828 Personal history of other malignant neoplasm of skin: Secondary | ICD-10-CM | POA: Diagnosis not present

## 2021-06-18 DIAGNOSIS — L820 Inflamed seborrheic keratosis: Secondary | ICD-10-CM | POA: Diagnosis not present

## 2021-09-04 DIAGNOSIS — M1711 Unilateral primary osteoarthritis, right knee: Secondary | ICD-10-CM | POA: Diagnosis not present

## 2021-11-07 DIAGNOSIS — Z23 Encounter for immunization: Secondary | ICD-10-CM | POA: Diagnosis not present

## 2021-11-28 DIAGNOSIS — R5383 Other fatigue: Secondary | ICD-10-CM | POA: Diagnosis not present

## 2021-11-28 DIAGNOSIS — Z1152 Encounter for screening for COVID-19: Secondary | ICD-10-CM | POA: Diagnosis not present

## 2021-11-28 DIAGNOSIS — R0981 Nasal congestion: Secondary | ICD-10-CM | POA: Diagnosis not present

## 2021-11-28 DIAGNOSIS — R030 Elevated blood-pressure reading, without diagnosis of hypertension: Secondary | ICD-10-CM | POA: Diagnosis not present

## 2021-11-28 DIAGNOSIS — J069 Acute upper respiratory infection, unspecified: Secondary | ICD-10-CM | POA: Diagnosis not present

## 2021-11-28 DIAGNOSIS — J029 Acute pharyngitis, unspecified: Secondary | ICD-10-CM | POA: Diagnosis not present

## 2021-11-28 DIAGNOSIS — J439 Emphysema, unspecified: Secondary | ICD-10-CM | POA: Diagnosis not present

## 2021-11-28 DIAGNOSIS — R051 Acute cough: Secondary | ICD-10-CM | POA: Diagnosis not present

## 2022-01-02 ENCOUNTER — Ambulatory Visit (HOSPITAL_BASED_OUTPATIENT_CLINIC_OR_DEPARTMENT_OTHER)
Admission: RE | Admit: 2022-01-02 | Discharge: 2022-01-02 | Disposition: A | Discharge status: Home / Self Care | Payer: Medicare Other | Source: Ambulatory Visit | Attending: Physician Assistant | Admitting: Physician Assistant

## 2022-01-02 ENCOUNTER — Other Ambulatory Visit: Payer: Self-pay

## 2022-01-02 ENCOUNTER — Other Ambulatory Visit (HOSPITAL_BASED_OUTPATIENT_CLINIC_OR_DEPARTMENT_OTHER): Payer: Self-pay | Admitting: Physician Assistant

## 2022-01-02 DIAGNOSIS — M79661 Pain in right lower leg: Secondary | ICD-10-CM | POA: Diagnosis not present

## 2022-01-02 DIAGNOSIS — M79604 Pain in right leg: Secondary | ICD-10-CM

## 2022-01-02 DIAGNOSIS — M79605 Pain in left leg: Secondary | ICD-10-CM | POA: Diagnosis not present

## 2022-01-02 DIAGNOSIS — M79662 Pain in left lower leg: Secondary | ICD-10-CM | POA: Diagnosis not present

## 2022-01-02 DIAGNOSIS — I82412 Acute embolism and thrombosis of left femoral vein: Secondary | ICD-10-CM | POA: Diagnosis not present

## 2022-01-02 DIAGNOSIS — I82462 Acute embolism and thrombosis of left calf muscular vein: Secondary | ICD-10-CM | POA: Diagnosis not present

## 2022-01-03 DIAGNOSIS — L03116 Cellulitis of left lower limb: Secondary | ICD-10-CM | POA: Diagnosis not present

## 2022-01-03 DIAGNOSIS — M79605 Pain in left leg: Secondary | ICD-10-CM | POA: Diagnosis not present

## 2022-01-03 DIAGNOSIS — I82462 Acute embolism and thrombosis of left calf muscular vein: Secondary | ICD-10-CM | POA: Diagnosis not present

## 2022-01-13 DIAGNOSIS — L84 Corns and callosities: Secondary | ICD-10-CM | POA: Diagnosis not present

## 2022-01-13 DIAGNOSIS — L821 Other seborrheic keratosis: Secondary | ICD-10-CM | POA: Diagnosis not present

## 2022-01-13 DIAGNOSIS — D485 Neoplasm of uncertain behavior of skin: Secondary | ICD-10-CM | POA: Diagnosis not present

## 2022-01-13 DIAGNOSIS — Z85828 Personal history of other malignant neoplasm of skin: Secondary | ICD-10-CM | POA: Diagnosis not present

## 2022-03-10 DIAGNOSIS — E785 Hyperlipidemia, unspecified: Secondary | ICD-10-CM | POA: Diagnosis not present

## 2022-03-10 DIAGNOSIS — R030 Elevated blood-pressure reading, without diagnosis of hypertension: Secondary | ICD-10-CM | POA: Diagnosis not present

## 2022-03-10 DIAGNOSIS — E559 Vitamin D deficiency, unspecified: Secondary | ICD-10-CM | POA: Diagnosis not present

## 2022-03-13 DIAGNOSIS — R350 Frequency of micturition: Secondary | ICD-10-CM | POA: Diagnosis not present

## 2022-03-13 DIAGNOSIS — R300 Dysuria: Secondary | ICD-10-CM | POA: Diagnosis not present

## 2022-03-17 DIAGNOSIS — Z23 Encounter for immunization: Secondary | ICD-10-CM | POA: Diagnosis not present

## 2022-03-17 DIAGNOSIS — E559 Vitamin D deficiency, unspecified: Secondary | ICD-10-CM | POA: Diagnosis not present

## 2022-03-17 DIAGNOSIS — M25551 Pain in right hip: Secondary | ICD-10-CM | POA: Diagnosis not present

## 2022-03-17 DIAGNOSIS — F39 Unspecified mood [affective] disorder: Secondary | ICD-10-CM | POA: Diagnosis not present

## 2022-03-17 DIAGNOSIS — Z1339 Encounter for screening examination for other mental health and behavioral disorders: Secondary | ICD-10-CM | POA: Diagnosis not present

## 2022-03-17 DIAGNOSIS — M25561 Pain in right knee: Secondary | ICD-10-CM | POA: Diagnosis not present

## 2022-03-17 DIAGNOSIS — Z86718 Personal history of other venous thrombosis and embolism: Secondary | ICD-10-CM | POA: Diagnosis not present

## 2022-03-17 DIAGNOSIS — Z0000 Encounter for general adult medical examination without abnormal findings: Secondary | ICD-10-CM | POA: Diagnosis not present

## 2022-03-17 DIAGNOSIS — E785 Hyperlipidemia, unspecified: Secondary | ICD-10-CM | POA: Diagnosis not present

## 2022-03-17 DIAGNOSIS — D6869 Other thrombophilia: Secondary | ICD-10-CM | POA: Diagnosis not present

## 2022-03-17 DIAGNOSIS — M8580 Other specified disorders of bone density and structure, unspecified site: Secondary | ICD-10-CM | POA: Diagnosis not present

## 2022-03-17 DIAGNOSIS — I700 Atherosclerosis of aorta: Secondary | ICD-10-CM | POA: Diagnosis not present

## 2022-03-17 DIAGNOSIS — Z1331 Encounter for screening for depression: Secondary | ICD-10-CM | POA: Diagnosis not present

## 2022-03-17 DIAGNOSIS — Z85820 Personal history of malignant melanoma of skin: Secondary | ICD-10-CM | POA: Diagnosis not present

## 2022-03-17 DIAGNOSIS — R030 Elevated blood-pressure reading, without diagnosis of hypertension: Secondary | ICD-10-CM | POA: Diagnosis not present

## 2022-04-25 ENCOUNTER — Ambulatory Visit (HOSPITAL_COMMUNITY)
Admission: RE | Admit: 2022-04-25 | Discharge: 2022-04-25 | Disposition: A | Discharge status: Home / Self Care | Payer: Medicare Other | Source: Ambulatory Visit | Attending: Internal Medicine | Admitting: Internal Medicine

## 2022-04-25 ENCOUNTER — Other Ambulatory Visit (HOSPITAL_COMMUNITY): Payer: Self-pay | Admitting: Internal Medicine

## 2022-04-25 DIAGNOSIS — M7989 Other specified soft tissue disorders: Secondary | ICD-10-CM

## 2022-04-25 DIAGNOSIS — Z86718 Personal history of other venous thrombosis and embolism: Secondary | ICD-10-CM | POA: Diagnosis not present

## 2022-04-30 DIAGNOSIS — Z1231 Encounter for screening mammogram for malignant neoplasm of breast: Secondary | ICD-10-CM | POA: Diagnosis not present

## 2022-04-30 DIAGNOSIS — D225 Melanocytic nevi of trunk: Secondary | ICD-10-CM | POA: Diagnosis not present

## 2022-04-30 DIAGNOSIS — D485 Neoplasm of uncertain behavior of skin: Secondary | ICD-10-CM | POA: Diagnosis not present

## 2022-04-30 DIAGNOSIS — L570 Actinic keratosis: Secondary | ICD-10-CM | POA: Diagnosis not present

## 2022-04-30 DIAGNOSIS — L905 Scar conditions and fibrosis of skin: Secondary | ICD-10-CM | POA: Diagnosis not present

## 2022-04-30 DIAGNOSIS — D1801 Hemangioma of skin and subcutaneous tissue: Secondary | ICD-10-CM | POA: Diagnosis not present

## 2022-04-30 DIAGNOSIS — Z85828 Personal history of other malignant neoplasm of skin: Secondary | ICD-10-CM | POA: Diagnosis not present

## 2022-04-30 DIAGNOSIS — L821 Other seborrheic keratosis: Secondary | ICD-10-CM | POA: Diagnosis not present

## 2022-07-11 DIAGNOSIS — M1711 Unilateral primary osteoarthritis, right knee: Secondary | ICD-10-CM | POA: Diagnosis not present

## 2022-08-14 ENCOUNTER — Other Ambulatory Visit (HOSPITAL_COMMUNITY): Payer: Self-pay | Admitting: Internal Medicine

## 2022-08-14 ENCOUNTER — Ambulatory Visit (HOSPITAL_COMMUNITY)
Admission: RE | Admit: 2022-08-14 | Discharge: 2022-08-14 | Disposition: A | Discharge status: Home / Self Care | Payer: Medicare Other | Source: Ambulatory Visit | Attending: Vascular Surgery | Admitting: Vascular Surgery

## 2022-08-14 DIAGNOSIS — Z86718 Personal history of other venous thrombosis and embolism: Secondary | ICD-10-CM | POA: Insufficient documentation

## 2022-08-14 NOTE — Progress Notes (Signed)
Attempted to call results for lower extremity venous duplex, no answer.   Cassandra Underwood 08/14/2022 2:51 PM

## 2022-09-08 DIAGNOSIS — L720 Epidermal cyst: Secondary | ICD-10-CM | POA: Diagnosis not present

## 2022-09-08 DIAGNOSIS — L820 Inflamed seborrheic keratosis: Secondary | ICD-10-CM | POA: Diagnosis not present

## 2022-09-08 DIAGNOSIS — Z85828 Personal history of other malignant neoplasm of skin: Secondary | ICD-10-CM | POA: Diagnosis not present

## 2022-09-08 DIAGNOSIS — L821 Other seborrheic keratosis: Secondary | ICD-10-CM | POA: Diagnosis not present

## 2022-09-08 DIAGNOSIS — L570 Actinic keratosis: Secondary | ICD-10-CM | POA: Diagnosis not present

## 2022-09-08 DIAGNOSIS — L738 Other specified follicular disorders: Secondary | ICD-10-CM | POA: Diagnosis not present

## 2022-09-08 DIAGNOSIS — D1801 Hemangioma of skin and subcutaneous tissue: Secondary | ICD-10-CM | POA: Diagnosis not present

## 2022-09-11 DIAGNOSIS — R638 Other symptoms and signs concerning food and fluid intake: Secondary | ICD-10-CM | POA: Diagnosis not present

## 2022-09-11 DIAGNOSIS — Z1152 Encounter for screening for COVID-19: Secondary | ICD-10-CM | POA: Diagnosis not present

## 2022-09-11 DIAGNOSIS — R0981 Nasal congestion: Secondary | ICD-10-CM | POA: Diagnosis not present

## 2022-09-11 DIAGNOSIS — R5383 Other fatigue: Secondary | ICD-10-CM | POA: Diagnosis not present

## 2022-09-11 DIAGNOSIS — J069 Acute upper respiratory infection, unspecified: Secondary | ICD-10-CM | POA: Diagnosis not present

## 2022-09-11 DIAGNOSIS — J029 Acute pharyngitis, unspecified: Secondary | ICD-10-CM | POA: Diagnosis not present

## 2022-09-11 DIAGNOSIS — R059 Cough, unspecified: Secondary | ICD-10-CM | POA: Diagnosis not present

## 2022-09-23 DIAGNOSIS — H02831 Dermatochalasis of right upper eyelid: Secondary | ICD-10-CM | POA: Diagnosis not present

## 2022-09-23 DIAGNOSIS — H18413 Arcus senilis, bilateral: Secondary | ICD-10-CM | POA: Diagnosis not present

## 2022-09-23 DIAGNOSIS — Z961 Presence of intraocular lens: Secondary | ICD-10-CM | POA: Diagnosis not present

## 2022-09-23 DIAGNOSIS — H02834 Dermatochalasis of left upper eyelid: Secondary | ICD-10-CM | POA: Diagnosis not present

## 2022-09-25 DIAGNOSIS — Z23 Encounter for immunization: Secondary | ICD-10-CM | POA: Diagnosis not present

## 2022-11-09 IMAGING — CT CT CARDIAC CORONARY ARTERY CALCIUM SCORE
3 series · 14 of 20 positions shown, 16 images · non-contrast
Comparison: None.

CLINICAL DATA: Hyperlipidemia

EXAM:
CT CARDIAC CORONARY ARTERY CALCIUM SCORE
TECHNIQUE: Non-contrast imaging through the heart was performed using
prospective ECG gating. Image post processing was performed on an
independent workstation, allowing for quantitative analysis of the
heart and coronary arteries. Note that this exam targets the heart
and the chest was not imaged in its entirety.

[Series 2: calcium scoring 2.00 qr36 bestdiast 71% hrt calciu · axial · 0.38mm/px · z∈[+1716,+1800]mm · 4 of 70 slices shown]
[im 14/70  vessel]
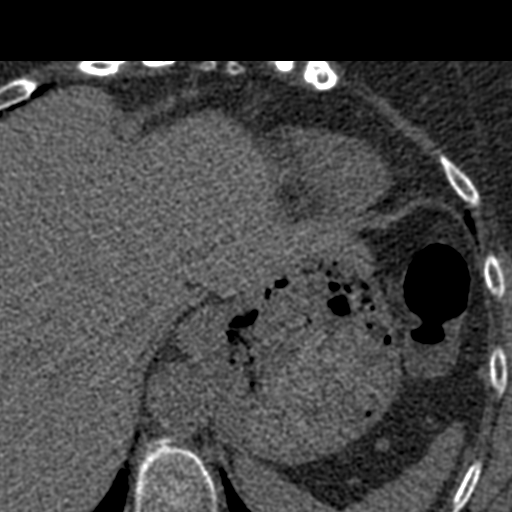
[im 28/70  vessel]
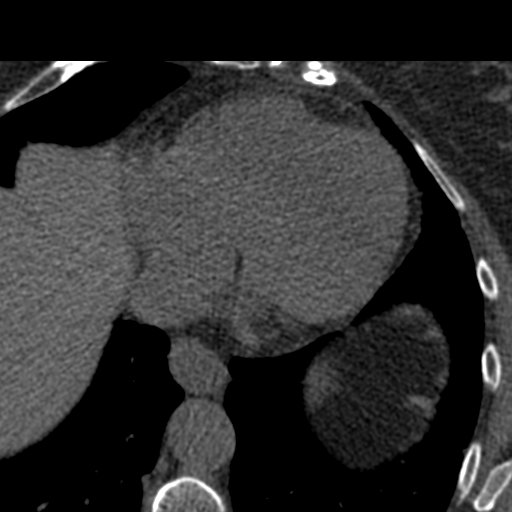
[im 42/70  vessel]
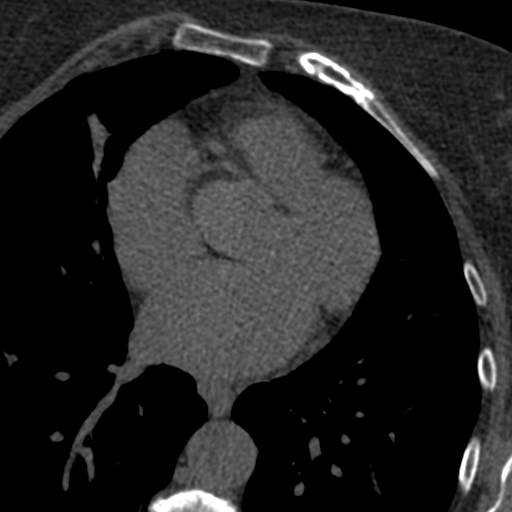
[im 56/70  vessel]
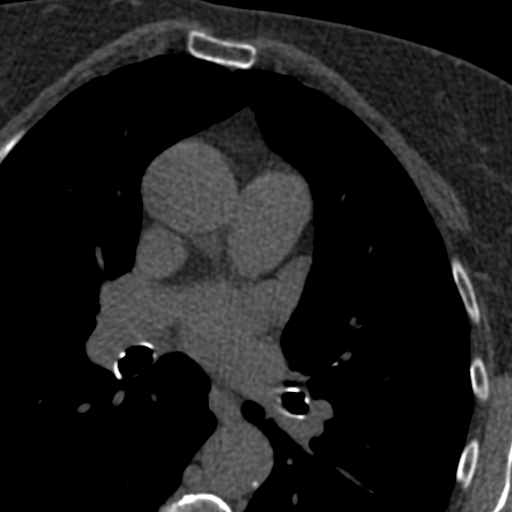

[Series 3: calcium scoring 2.00 br40 bestdiast 71% axial · axial · 0.57mm/px · z∈[+1712,+1804]mm · 5 of 70 slices shown, 7 images]
[im 12/70  vessel]
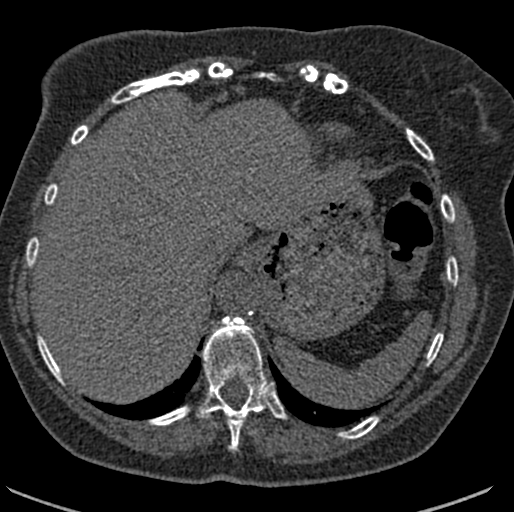
[im 12/70  lung]
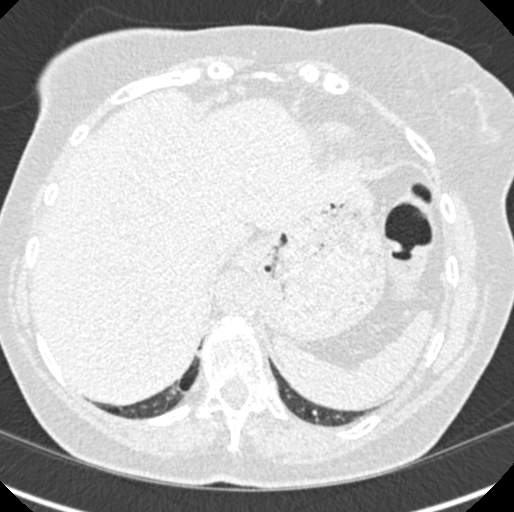
[im 24/70  vessel]
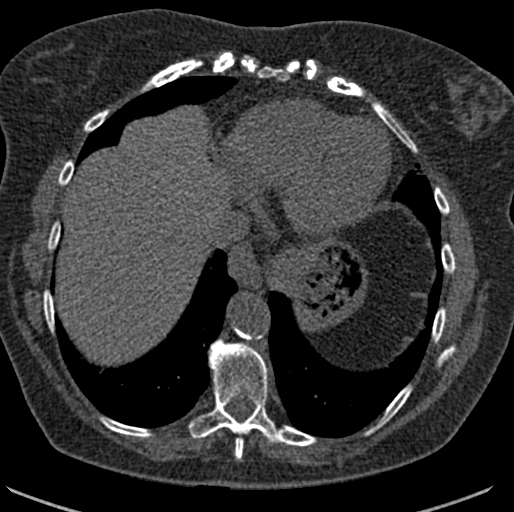
[im 35/70  vessel]
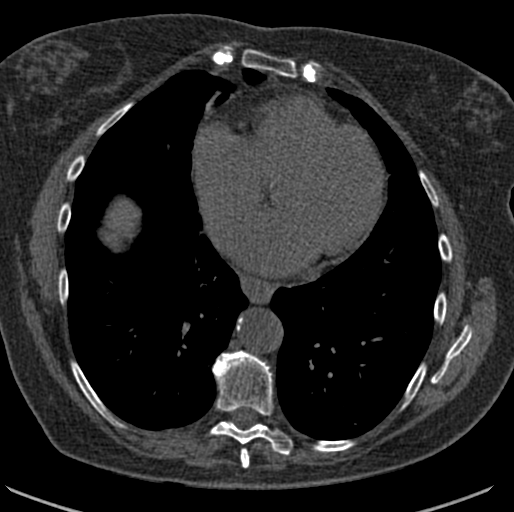
[im 47/70  vessel]
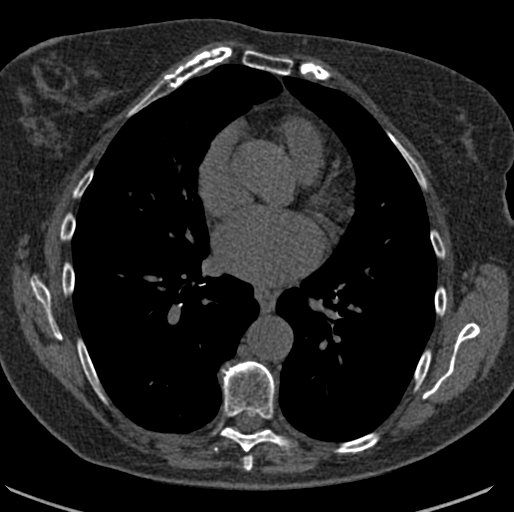
[im 58/70  vessel]
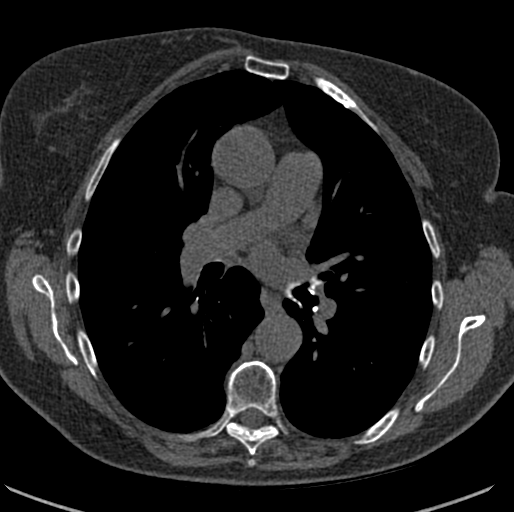
[im 58/70  lung]
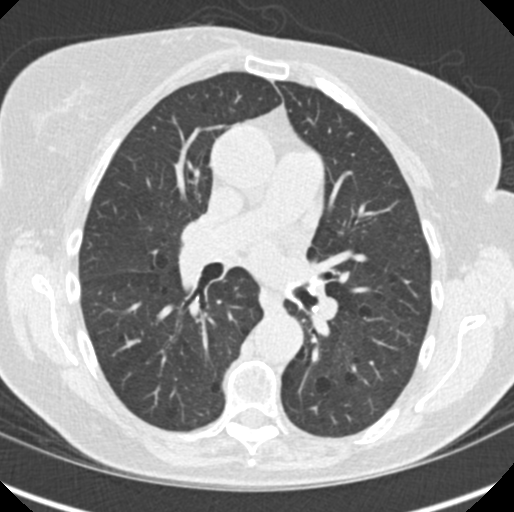

[Series 9: calcium scoring 2.00 br60 bestdiast 71% lungs · axial · 0.55mm/px · z∈[+1712,+1804]mm · 5 of 70 slices shown]
[im 12/70  vessel]
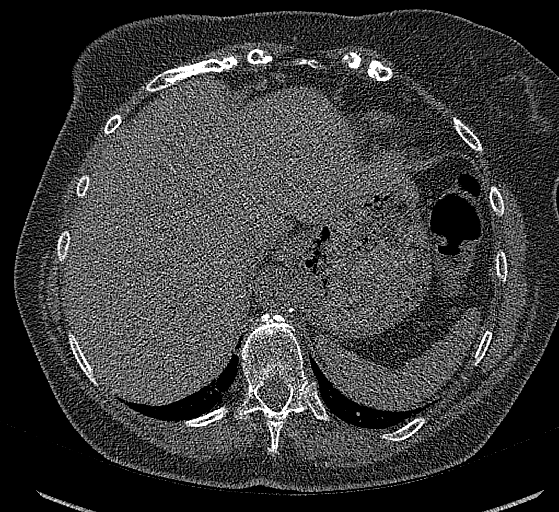
[im 24/70  vessel]
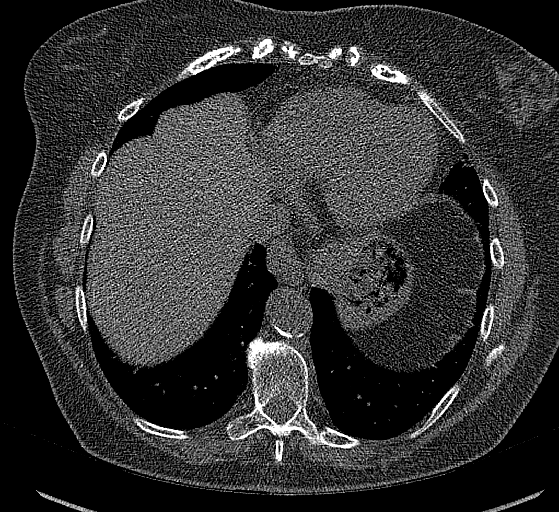
[im 35/70  vessel]
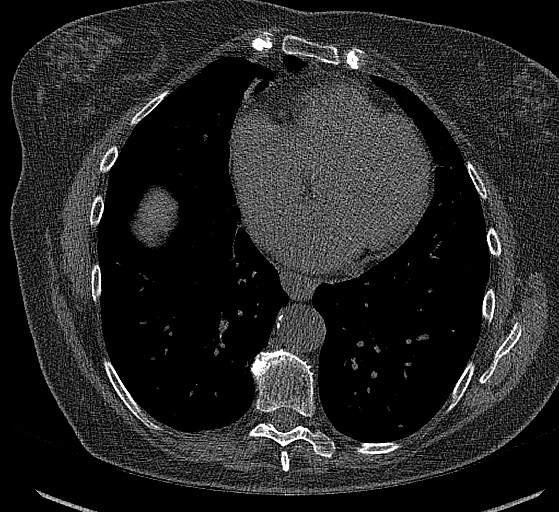
[im 47/70  vessel]
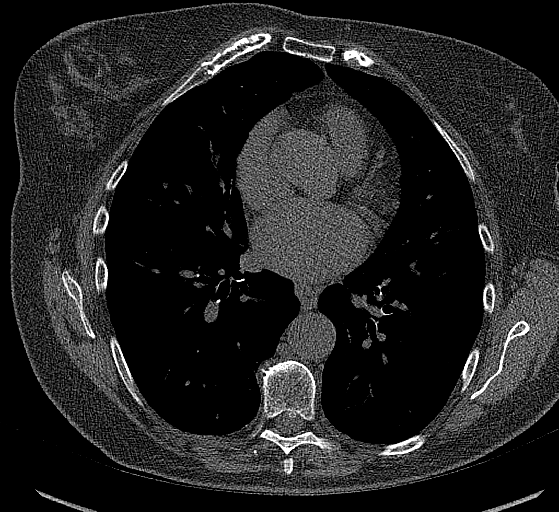
[im 58/70  vessel]
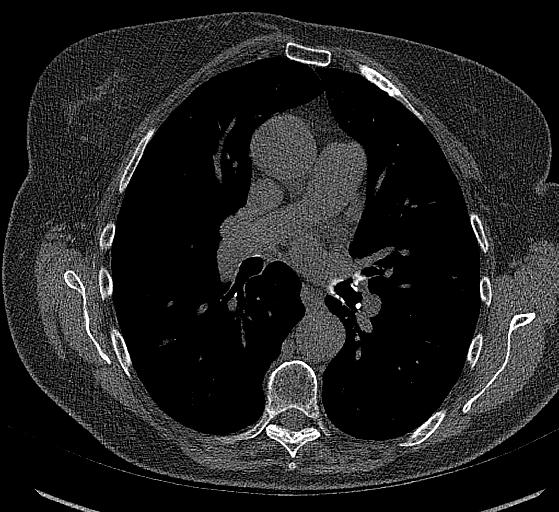

[14 of 20 positions shown; findings below may reference images not displayed]

FINDINGS: CORONARY CALCIUM SCORES:

Left Main: 0

LAD:

LCx: 0

RCA: 0

Total Agatston Score:

[HOSPITAL] percentile: 20

AORTA MEASUREMENTS:

Ascending Aorta: 35 mm

Descending Aorta: 27 mm

OTHER FINDINGS:

Heart is normal size. Calcifications in the aortic valve and
scattered throughout the aorta. No adenopathy. Mild emphysema.
Scarring in the lingula right mid. Scarring in both lower lobes at
the lung bases. No effusions. Imaging into the upper abdomen
demonstrates no acute findings. Chest wall soft tissues are
unremarkable. No acute bony abnormality.
IMPRESSION: Total Agatston score:

[HOSPITAL] percentile: 20

Aortic atherosclerosis.

Emphysema/chronic changes.

No acute extra cardiac abnormality.

## 2022-11-27 DIAGNOSIS — M1711 Unilateral primary osteoarthritis, right knee: Secondary | ICD-10-CM | POA: Diagnosis not present

## 2023-01-20 DIAGNOSIS — L821 Other seborrheic keratosis: Secondary | ICD-10-CM | POA: Diagnosis not present

## 2023-01-20 DIAGNOSIS — D1801 Hemangioma of skin and subcutaneous tissue: Secondary | ICD-10-CM | POA: Diagnosis not present

## 2023-01-20 DIAGNOSIS — I8311 Varicose veins of right lower extremity with inflammation: Secondary | ICD-10-CM | POA: Diagnosis not present

## 2023-01-20 DIAGNOSIS — I8312 Varicose veins of left lower extremity with inflammation: Secondary | ICD-10-CM | POA: Diagnosis not present

## 2023-01-20 DIAGNOSIS — L570 Actinic keratosis: Secondary | ICD-10-CM | POA: Diagnosis not present

## 2023-01-20 DIAGNOSIS — D225 Melanocytic nevi of trunk: Secondary | ICD-10-CM | POA: Diagnosis not present

## 2023-01-20 DIAGNOSIS — Z85828 Personal history of other malignant neoplasm of skin: Secondary | ICD-10-CM | POA: Diagnosis not present

## 2023-01-20 DIAGNOSIS — I872 Venous insufficiency (chronic) (peripheral): Secondary | ICD-10-CM | POA: Diagnosis not present

## 2023-04-01 DIAGNOSIS — E559 Vitamin D deficiency, unspecified: Secondary | ICD-10-CM | POA: Diagnosis not present

## 2023-04-01 DIAGNOSIS — R030 Elevated blood-pressure reading, without diagnosis of hypertension: Secondary | ICD-10-CM | POA: Diagnosis not present

## 2023-04-01 DIAGNOSIS — E785 Hyperlipidemia, unspecified: Secondary | ICD-10-CM | POA: Diagnosis not present

## 2023-04-01 DIAGNOSIS — R7989 Other specified abnormal findings of blood chemistry: Secondary | ICD-10-CM | POA: Diagnosis not present

## 2023-04-07 DIAGNOSIS — M8580 Other specified disorders of bone density and structure, unspecified site: Secondary | ICD-10-CM | POA: Diagnosis not present

## 2023-04-07 DIAGNOSIS — E785 Hyperlipidemia, unspecified: Secondary | ICD-10-CM | POA: Diagnosis not present

## 2023-04-07 DIAGNOSIS — F39 Unspecified mood [affective] disorder: Secondary | ICD-10-CM | POA: Diagnosis not present

## 2023-04-07 DIAGNOSIS — Z0000 Encounter for general adult medical examination without abnormal findings: Secondary | ICD-10-CM | POA: Diagnosis not present

## 2023-04-07 DIAGNOSIS — Z1331 Encounter for screening for depression: Secondary | ICD-10-CM | POA: Diagnosis not present

## 2023-04-07 DIAGNOSIS — R030 Elevated blood-pressure reading, without diagnosis of hypertension: Secondary | ICD-10-CM | POA: Diagnosis not present

## 2023-04-07 DIAGNOSIS — R82998 Other abnormal findings in urine: Secondary | ICD-10-CM | POA: Diagnosis not present

## 2023-04-07 DIAGNOSIS — I700 Atherosclerosis of aorta: Secondary | ICD-10-CM | POA: Diagnosis not present

## 2023-04-07 DIAGNOSIS — Z1339 Encounter for screening examination for other mental health and behavioral disorders: Secondary | ICD-10-CM | POA: Diagnosis not present

## 2023-04-07 DIAGNOSIS — M25561 Pain in right knee: Secondary | ICD-10-CM | POA: Diagnosis not present

## 2023-04-07 DIAGNOSIS — E559 Vitamin D deficiency, unspecified: Secondary | ICD-10-CM | POA: Diagnosis not present

## 2023-04-07 DIAGNOSIS — Z85820 Personal history of malignant melanoma of skin: Secondary | ICD-10-CM | POA: Diagnosis not present

## 2023-04-07 DIAGNOSIS — Z86718 Personal history of other venous thrombosis and embolism: Secondary | ICD-10-CM | POA: Diagnosis not present

## 2023-04-24 DIAGNOSIS — M25511 Pain in right shoulder: Secondary | ICD-10-CM | POA: Diagnosis not present

## 2023-04-30 DIAGNOSIS — M1711 Unilateral primary osteoarthritis, right knee: Secondary | ICD-10-CM | POA: Diagnosis not present

## 2023-05-05 DIAGNOSIS — Z1231 Encounter for screening mammogram for malignant neoplasm of breast: Secondary | ICD-10-CM | POA: Diagnosis not present

## 2023-05-07 DIAGNOSIS — N958 Other specified menopausal and perimenopausal disorders: Secondary | ICD-10-CM | POA: Diagnosis not present

## 2023-05-07 DIAGNOSIS — R922 Inconclusive mammogram: Secondary | ICD-10-CM | POA: Diagnosis not present

## 2023-05-07 DIAGNOSIS — M8588 Other specified disorders of bone density and structure, other site: Secondary | ICD-10-CM | POA: Diagnosis not present

## 2023-05-12 DIAGNOSIS — M25561 Pain in right knee: Secondary | ICD-10-CM | POA: Diagnosis not present

## 2023-05-12 DIAGNOSIS — M1711 Unilateral primary osteoarthritis, right knee: Secondary | ICD-10-CM | POA: Diagnosis not present

## 2023-05-12 DIAGNOSIS — M25661 Stiffness of right knee, not elsewhere classified: Secondary | ICD-10-CM | POA: Diagnosis not present

## 2023-06-17 DIAGNOSIS — L570 Actinic keratosis: Secondary | ICD-10-CM | POA: Diagnosis not present

## 2023-06-17 DIAGNOSIS — Z85828 Personal history of other malignant neoplasm of skin: Secondary | ICD-10-CM | POA: Diagnosis not present

## 2023-06-17 DIAGNOSIS — C44729 Squamous cell carcinoma of skin of left lower limb, including hip: Secondary | ICD-10-CM | POA: Diagnosis not present

## 2023-07-14 DIAGNOSIS — Z85828 Personal history of other malignant neoplasm of skin: Secondary | ICD-10-CM | POA: Diagnosis not present

## 2023-07-14 DIAGNOSIS — C44729 Squamous cell carcinoma of skin of left lower limb, including hip: Secondary | ICD-10-CM | POA: Diagnosis not present

## 2023-07-14 IMAGING — US US EXTREM LOW VENOUS*L*
1 series · 13 of 24 positions shown · non-contrast
Comparison: None.

CLINICAL DATA: Leg pain



[Series 1: us venous img lower uni left (dvt) · portal-venous · 13 of 57 slices shown]
[im 1/57]
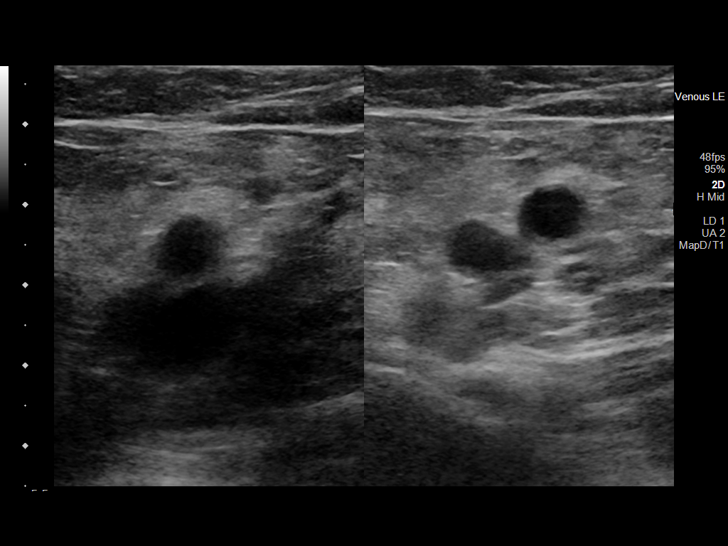
[im 5/57]
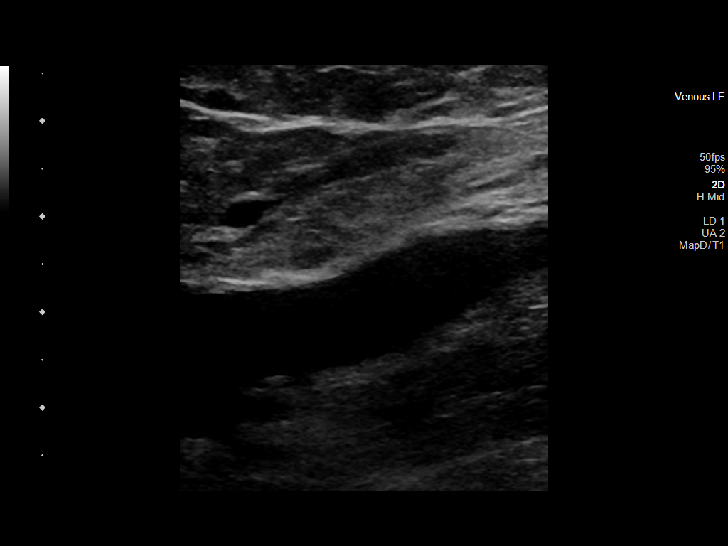
[im 10/57]
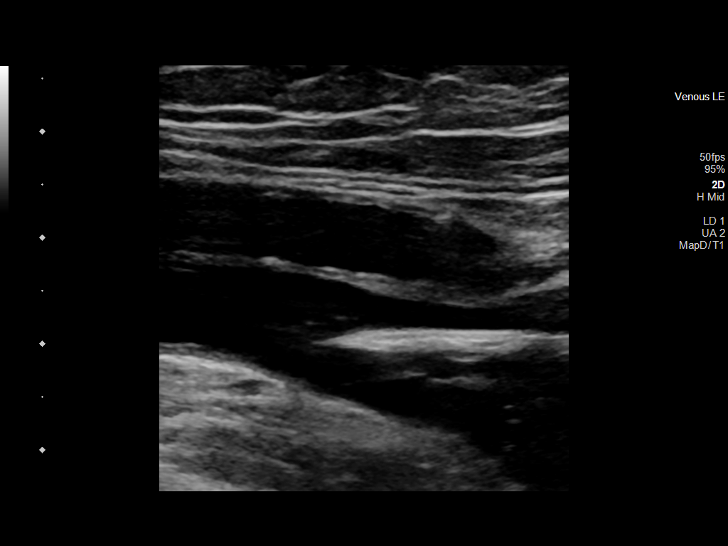
[im 15/57]
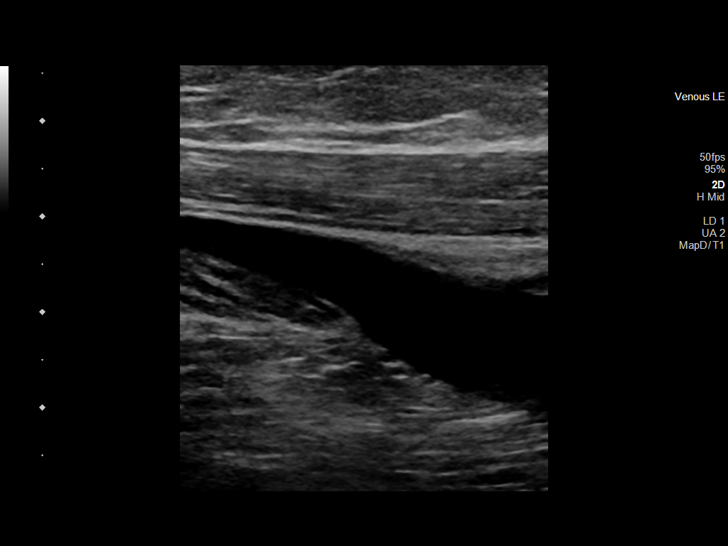
[im 20/57]
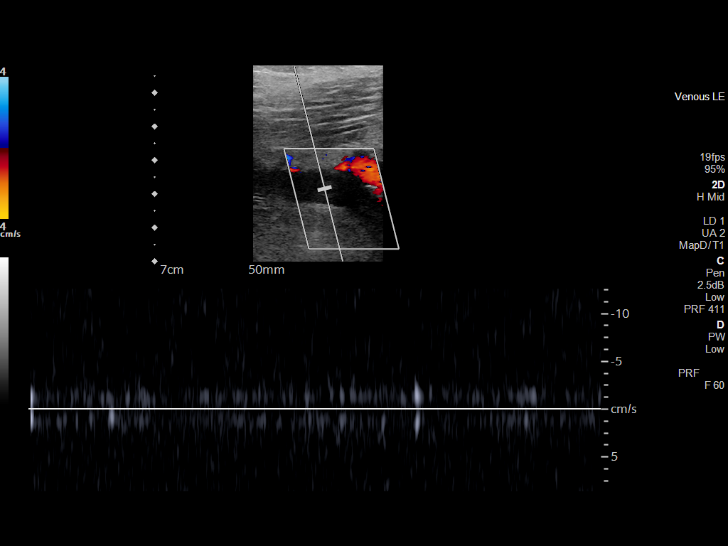
[im 25/57]
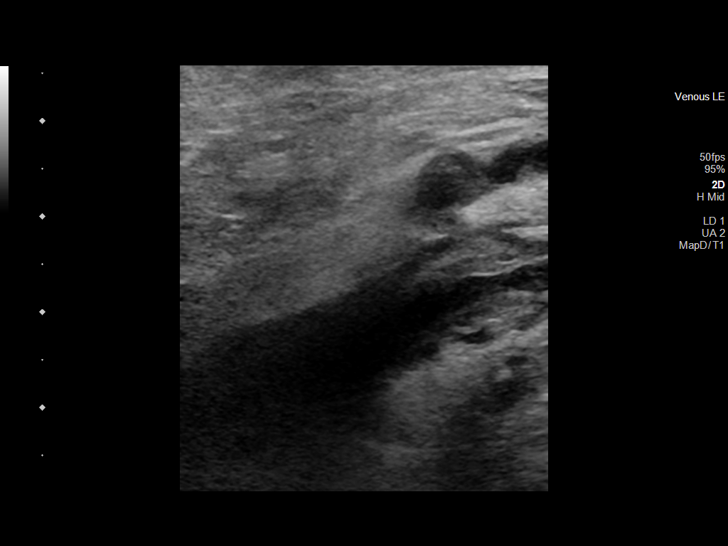
[im 30/57]
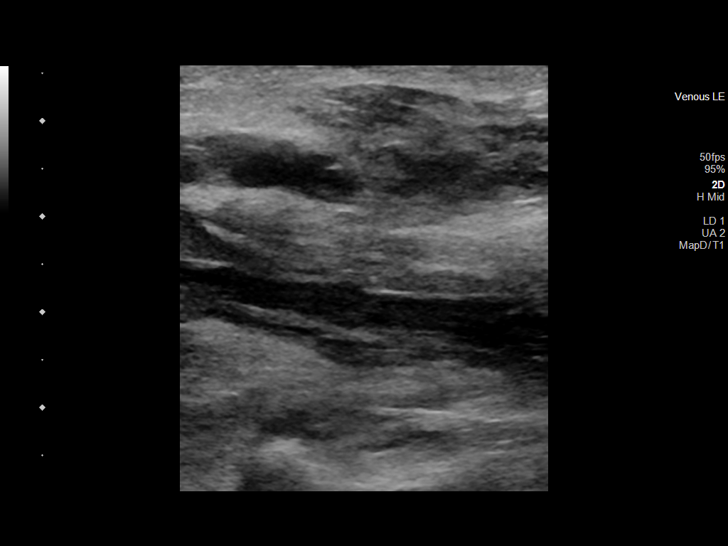
[im 32/57]
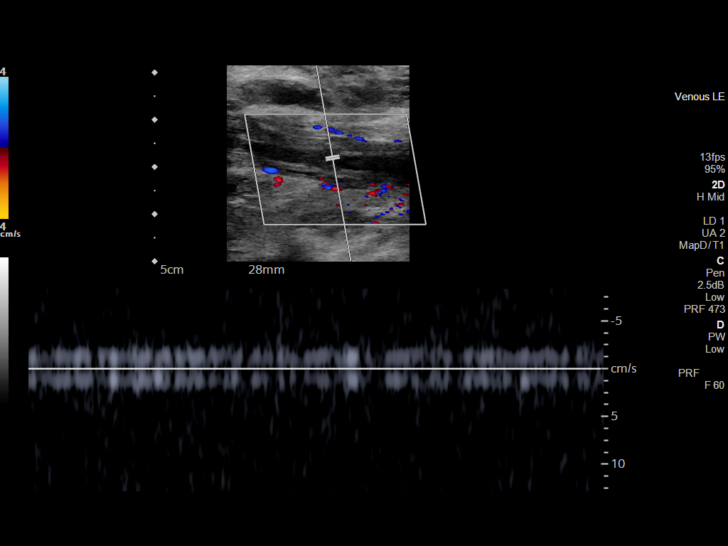
[im 37/57]
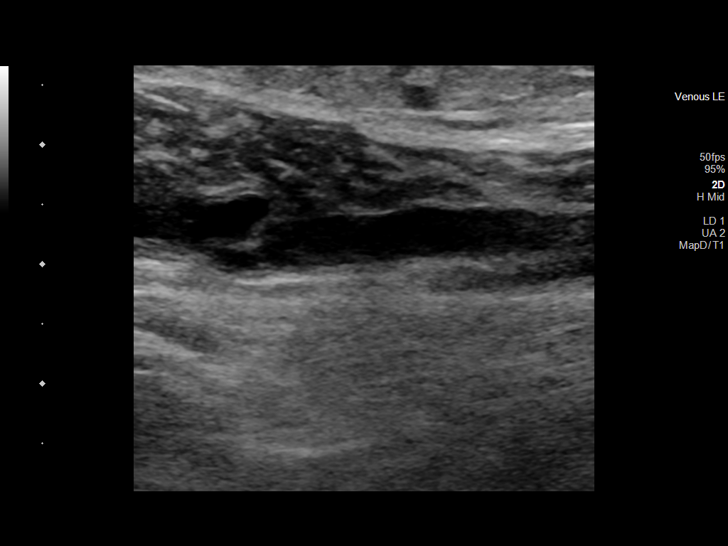
[im 42/57]
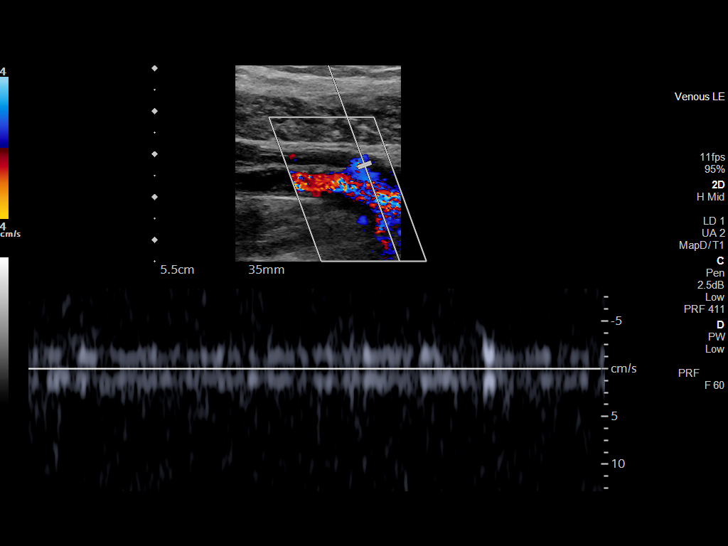
[im 47/57]
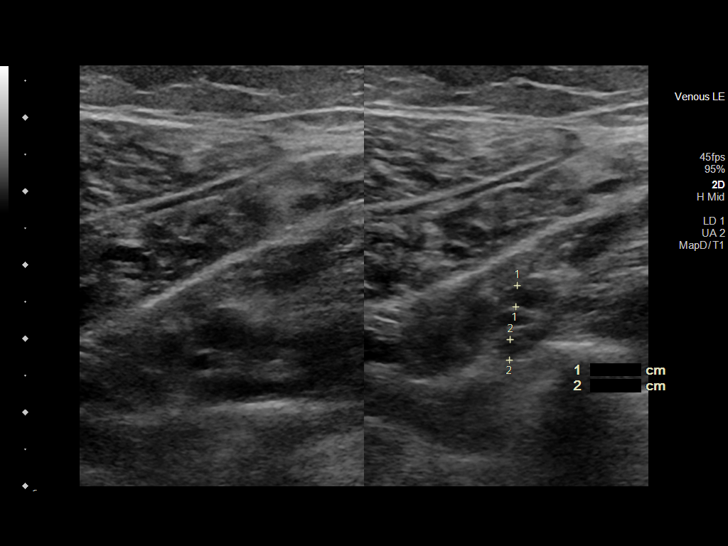
[im 52/57]
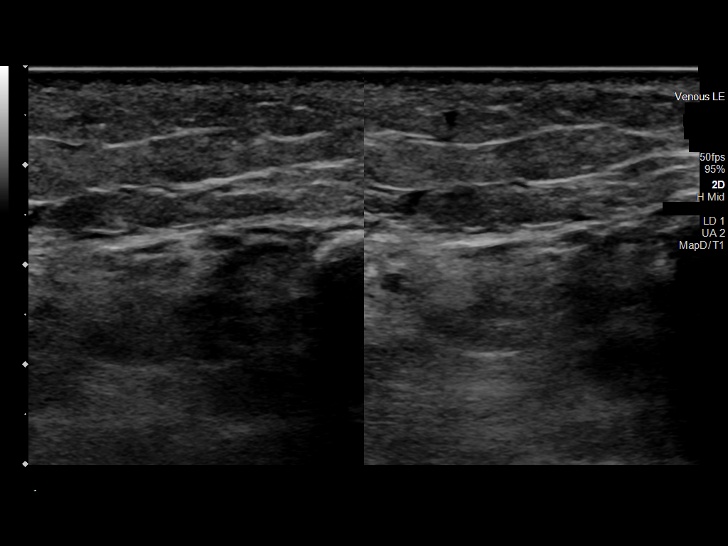
[im 57/57]
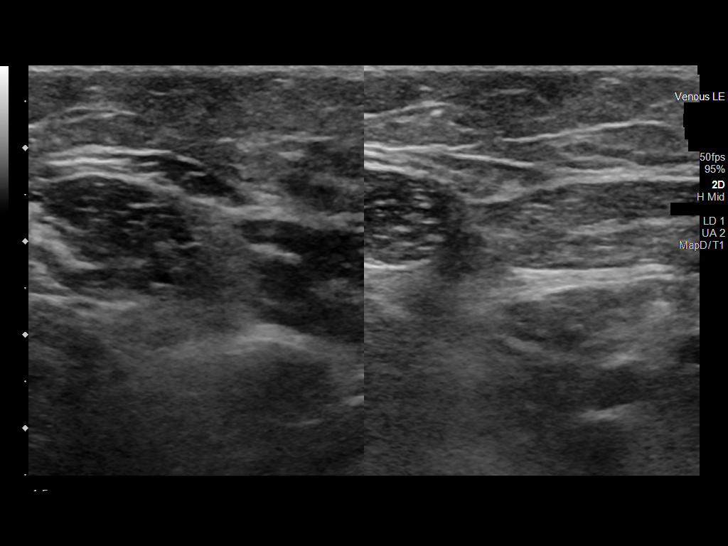

[13 of 24 positions shown; findings below may reference images not displayed]

FINDINGS: Contralateral Common Femoral Vein: Respiratory phasicity is normal
and symmetric with the symptomatic side. No evidence of thrombus.
Normal compressibility.

Common Femoral Vein: No evidence of thrombus. Normal
compressibility, respiratory phasicity and response to augmentation.

Saphenofemoral Junction: No evidence of thrombus. Normal
compressibility and flow on color Doppler imaging.

Profunda Femoral Vein: No evidence of thrombus. Normal
compressibility and flow on color Doppler imaging.

Femoral Vein: Positive for acute near occlusive DVT in the distal
femoral vein. The vein is noncompressible.

Popliteal Vein: Positive for acute subocclusive DVT. The vein is
noncompressible.

Calf Veins: Positive for acute subocclusive DVT within the posterior
tibial and peroneal veins. Positive for acute occlusive thrombus
within the gastrocnemius veins.

Superficial Great Saphenous Vein: No evidence of thrombus. Normal
compressibility.
IMPRESSION: Positive for acute DVT extending from the calf veins to the distal
femoral vein.

Critical Value/emergent results were called by telephone at the time
of interpretation on 01/02/2022 at [DATE] to provider PA Lulu Kong ,
who verbally acknowledged these results. Patient was sent to the
emergency department.

## 2023-07-30 DIAGNOSIS — L0889 Other specified local infections of the skin and subcutaneous tissue: Secondary | ICD-10-CM | POA: Diagnosis not present

## 2023-08-19 NOTE — Progress Notes (Addendum)
Anesthesia Review:  PCP: DR Martha Clan  Cardiologist : none  Chest x-ray : EKG : CT Card - 2022  Echo : Stress test: Cardiac Cath :  Activity level: can do a flight of stairs without difficutly  Sleep Study/ CPAP : none  Fasting Blood Sugar :      / Checks Blood Sugar -- times a day:   Blood Thinner/ Instructions /Last Dose: ASA / Instructions/ Last Dose :    Pt with recent Mohs surgery on left lower leg  done by DRGoodrich at [facility] 06/2023.  PT reports at preop having been on 3 rounds of antibiotics due to infection after Mohs surgery.  Most recent antibiotics completed 2 weeks ago.   Does not recall name of antibiotics.  Area is approximately 3 inches in length and 2 inches wide.  Almost healed No drainage.  Pt denies any fever or chills at home.  PT also has recent cut on left leg approximately 2 inches in length.  Pt reports left leg has been swollen.  Leg looks slightly swollen at preop .  PT has appt on 08/26/23 with DR Auisio and will make him aware.  PT reports hx of DVT in left leg and was rechecked a year ago and was calcified per pt.     Husband with recent TIA approx a month ago.  PT reports at preop.  PT 's husband with stroke in 2018   PT does have hired help coming in once she has surgery.  Pt's husband with no current deficits.  Marland Kitchen

## 2023-08-19 NOTE — Patient Instructions (Signed)
SURGICAL WAITING ROOM VISITATION  Patients having surgery or a procedure may have no more than 2 support people in the waiting area - these visitors may rotate.    Children under the age of 11 must have an adult with them who is not the patient.  Due to an increase in RSV and influenza rates and associated hospitalizations, children ages 59 and under may not visit patients in [facility] hospitals.  If the patient needs to stay at the hospital during part of their recovery, the visitor guidelines for inpatient rooms apply. Pre-op nurse will coordinate an appropriate time for 1 support person to accompany patient in pre-op.  This support person may not rotate.    Please refer to the [facility] website for the visitor guidelines for Inpatients (after your surgery is over and you are in a regular room).       Your procedure is scheduled on:  08/31/2023    Report to [facility] Main Entrance    Report to admitting at  0800 AM   Call this number if you have problems the morning of surgery [phone]   Do not eat food :After Midnight.   After Midnight you may have the following liquids until _ 0730_____ AM DAY OF SURGERY  Water Non-Citrus Juices (without pulp, NO RED-Apple, White grape, White cranberry) Black Coffee (NO MILK/CREAM OR CREAMERS, sugar ok)  Clear Tea (NO MILK/CREAM OR CREAMERS, sugar ok) regular and decaf                             Plain Jell-O (NO RED)                                           Fruit ices (not with fruit pulp, NO RED)                                     Popsicles (NO RED)                                                               Sports drinks like Gatorade (NO RED)                     The day of surgery:  Drink ONE (1) Pre-Surgery Clear Ensure or G2 at   0730AM  ( have completed by ) the morning of surgery. Drink in one sitting. Do not sip.  This drink was given to you during your hospital  pre-op appointment visit. Nothing else to  drink after completing the  Pre-Surgery Clear Ensure or G2.          If you have questions, please contact your surgeon's office.       Oral Hygiene is also important to reduce your risk of infection.                                    Remember - BRUSH YOUR TEETH THE MORNING OF SURGERY  WITH YOUR REGULAR TOOTHPASTE  DENTURES WILL BE REMOVED PRIOR TO SURGERY PLEASE DO NOT APPLY "Poly grip" OR ADHESIVES!!!   Do NOT smoke after Midnight   Stop all vitamins and herbal supplements 7 days before surgery.   Take these medicines the morning of surgery with A SIP OF WATER:  none   DO NOT TAKE ANY ORAL DIABETIC MEDICATIONS DAY OF YOUR SURGERY  Bring CPAP mask and tubing day of surgery.                              You may not have any metal on your body including hair pins, jewelry, and body piercing             Do not wear make-up, lotions, powders, perfumes/cologne, or deodorant  Do not wear nail polish including gel and S&S, artificial/acrylic nails, or any other type of covering on natural nails including finger and toenails. If you have artificial nails, gel coating, etc. that needs to be removed by a nail salon please have this removed prior to surgery or surgery may need to be canceled/ delayed if the surgeon/ anesthesia feels like they are unable to be safely monitored.   Do not shave  48 hours prior to surgery.               Men may shave face and neck.   Do not bring valuables to the hospital. [facility] IS NOT             RESPONSIBLE   FOR VALUABLES.   Contacts, glasses, dentures or bridgework may not be worn into surgery.   Bring small overnight bag day of surgery.   DO NOT BRING YOUR HOME MEDICATIONS TO THE HOSPITAL. PHARMACY WILL DISPENSE MEDICATIONS LISTED ON YOUR MEDICATION LIST TO YOU DURING YOUR ADMISSION IN THE HOSPITAL!    Patients discharged on the day of surgery will not be allowed to drive home.  Someone NEEDS to stay with you for the first 24 hours after  anesthesia.   Special Instructions: Bring a copy of your healthcare power of attorney and living will documents the day of surgery if you haven't scanned them before.              Please read over the following fact sheets you were given: IF YOU HAVE QUESTIONS ABOUT YOUR PRE-OP INSTRUCTIONS PLEASE CALL [phone]   If you received a COVID test during your pre-op visit  it is requested that you wear a mask when out in public, stay away from anyone that may not be feeling well and notify your surgeon if you develop symptoms. If you test positive for Covid or have been in contact with anyone that has tested positive in the last 10 days please notify you surgeon.    ,  Pre-operative 5 CHG Bath Instructions   You can play a key role in reducing the risk of infection after surgery. Your skin needs to be as free of germs as possible. You can reduce the number of germs on your skin by washing with CHG (chlorhexidine gluconate) soap before surgery. CHG is an antiseptic soap that kills germs and continues to kill germs even after washing.   DO NOT use if you have an allergy to chlorhexidine/CHG or antibacterial soaps. If your skin becomes reddened or irritated, stop using the CHG and notify one of our RNs at [phone].   Please shower with the CHG soap  starting 4 days before surgery using the following schedule:     Please keep in mind the following:  DO NOT shave, including legs and underarms, starting the day of your first shower.   You may shave your face at any point before/day of surgery.  Place clean sheets on your bed the day you start using CHG soap. Use a clean washcloth (not used since being washed) for each shower. DO NOT sleep with pets once you start using the CHG.   CHG Shower Instructions:  If you choose to wash your hair and private area, wash first with your normal shampoo/soap.  After you use shampoo/soap, rinse your hair and body thoroughly to remove shampoo/soap residue.   Turn the water OFF and apply about 3 tablespoons (45 ml) of CHG soap to a CLEAN washcloth.  Apply CHG soap ONLY FROM YOUR NECK DOWN TO YOUR TOES (washing for 3-5 minutes)  DO NOT use CHG soap on face, private areas, open wounds, or sores.  Pay special attention to the area where your surgery is being performed.  If you are having back surgery, having someone wash your back for you may be helpful. Wait 2 minutes after CHG soap is applied, then you may rinse off the CHG soap.  Pat dry with a clean towel  Put on clean clothes/pajamas   If you choose to wear lotion, please use ONLY the CHG-compatible lotions on the back of this paper.     Additional instructions for the day of surgery: DO NOT APPLY any lotions, deodorants, cologne, or perfumes.   Put on clean/comfortable clothes.  Brush your teeth.  Ask your nurse before applying any prescription medications to the skin.      CHG Compatible Lotions   Aveeno Moisturizing lotion  Cetaphil Moisturizing Cream  Cetaphil Moisturizing Lotion  Clairol Herbal Essence Moisturizing Lotion, Dry Skin  Clairol Herbal Essence Moisturizing Lotion, Extra Dry Skin  Clairol Herbal Essence Moisturizing Lotion, Normal Skin  Curel Age Defying Therapeutic Moisturizing Lotion with Alpha Hydroxy  Curel Extreme Care Body Lotion  Curel Soothing Hands Moisturizing Hand Lotion  Curel Therapeutic Moisturizing Cream, Fragrance-Free  Curel Therapeutic Moisturizing Lotion, Fragrance-Free  Curel Therapeutic Moisturizing Lotion, Original Formula  Eucerin Daily Replenishing Lotion  Eucerin Dry Skin Therapy Plus Alpha Hydroxy Crme  Eucerin Dry Skin Therapy Plus Alpha Hydroxy Lotion  Eucerin Original Crme  Eucerin Original Lotion  Eucerin Plus Crme Eucerin Plus Lotion  Eucerin TriLipid Replenishing Lotion  Keri Anti-Bacterial Hand Lotion  Keri Deep Conditioning Original Lotion Dry Skin Formula Softly Scented  Keri Deep Conditioning Original Lotion, Fragrance  Free Sensitive Skin Formula  Keri Lotion Fast Absorbing Fragrance Free Sensitive Skin Formula  Keri Lotion Fast Absorbing Softly Scented Dry Skin Formula  Keri Original Lotion  Keri Skin Renewal Lotion Keri Silky Smooth Lotion  Keri Silky Smooth Sensitive Skin Lotion  Nivea Body Creamy Conditioning Oil  Nivea Body Extra Enriched Teacher, adult education Moisturizing Lotion Nivea Crme  Nivea Skin Firming Lotion  NutraDerm 30 Skin Lotion  NutraDerm Skin Lotion  NutraDerm Therapeutic Skin Cream  NutraDerm Therapeutic Skin Lotion  ProShield Protective Hand Cream  Provon moisturizing lotion

## 2023-08-21 DIAGNOSIS — Z85828 Personal history of other malignant neoplasm of skin: Secondary | ICD-10-CM | POA: Diagnosis not present

## 2023-08-21 DIAGNOSIS — I872 Venous insufficiency (chronic) (peripheral): Secondary | ICD-10-CM | POA: Diagnosis not present

## 2023-08-21 DIAGNOSIS — I8312 Varicose veins of left lower extremity with inflammation: Secondary | ICD-10-CM | POA: Diagnosis not present

## 2023-08-21 DIAGNOSIS — I8311 Varicose veins of right lower extremity with inflammation: Secondary | ICD-10-CM | POA: Diagnosis not present

## 2023-08-26 ENCOUNTER — Encounter (HOSPITAL_COMMUNITY)
Admission: RE | Admit: 2023-08-26 | Discharge: 2023-08-26 | Disposition: A | Discharge status: Home / Self Care | Payer: Medicare Other | Source: Ambulatory Visit | Attending: Orthopedic Surgery | Admitting: Orthopedic Surgery

## 2023-08-26 ENCOUNTER — Encounter (HOSPITAL_COMMUNITY): Payer: Self-pay

## 2023-08-26 ENCOUNTER — Other Ambulatory Visit: Payer: Self-pay

## 2023-08-26 VITALS — BP 150/78 | HR 70 | Temp 98.4°F | Resp 16 | Ht 63.5 in | Wt 134.0 lb

## 2023-08-26 DIAGNOSIS — Z9889 Other specified postprocedural states: Secondary | ICD-10-CM | POA: Insufficient documentation

## 2023-08-26 DIAGNOSIS — Z01818 Encounter for other preprocedural examination: Secondary | ICD-10-CM | POA: Diagnosis present

## 2023-08-26 DIAGNOSIS — Z01812 Encounter for preprocedural laboratory examination: Secondary | ICD-10-CM | POA: Diagnosis not present

## 2023-08-26 HISTORY — DX: Acute embolism and thrombosis of unspecified deep veins of unspecified lower extremity: I82.409

## 2023-08-26 HISTORY — DX: Pneumonia, unspecified organism: J18.9

## 2023-08-26 HISTORY — DX: Malignant (primary) neoplasm, unspecified: C80.1

## 2023-08-26 LAB — BASIC METABOLIC PANEL
Anion gap: 7 (ref 5–15)
BUN: 16 mg/dL (ref 8–23)
CO2: 27 mmol/L (ref 22–32)
Calcium: 9 mg/dL (ref 8.9–10.3)
Chloride: 105 mmol/L (ref 98–111)
Creatinine, Ser: 0.72 mg/dL (ref 0.44–1.00)
GFR, Estimated: >60 mL/min (ref >60)
Glucose, Bld: 94 mg/dL (ref 70–99)
Potassium: 4.2 mmol/L (ref 3.5–5.1)
Sodium: 139 mmol/L (ref 135–145)

## 2023-08-26 LAB — CBC
HCT: 45.7 % (ref 36.0–46.0)
Hemoglobin: 14.5 g/dL (ref 12.0–15.0)
MCH: 29.4 pg (ref 26.0–34.0)
MCHC: 31.7 g/dL (ref 30.0–36.0)
MCV: 92.7 fL (ref 80.0–100.0)
Platelets: 270 10*3/uL (ref 150–400)
RBC: 4.93 MIL/uL (ref 3.87–5.11)
RDW: 12.9 % (ref 11.5–15.5)
WBC: 6.1 10*3/uL (ref 4.0–10.5)
nRBC: 0 % (ref 0.0–0.2)

## 2023-08-26 LAB — SURGICAL PCR SCREEN
MRSA, PCR: NEGATIVE
Staphylococcus aureus: NEGATIVE

## 2023-08-31 ENCOUNTER — Encounter (HOSPITAL_COMMUNITY): Payer: Self-pay | Admitting: Orthopedic Surgery

## 2023-08-31 ENCOUNTER — Observation Stay (HOSPITAL_COMMUNITY)
Admission: RE | Admit: 2023-08-31 | Discharge: 2023-09-01 | Disposition: A | Discharge status: Home / Self Care | Payer: Medicare Other | Source: Ambulatory Visit | Attending: Orthopedic Surgery | Admitting: Orthopedic Surgery

## 2023-08-31 ENCOUNTER — Other Ambulatory Visit: Payer: Self-pay

## 2023-08-31 ENCOUNTER — Ambulatory Visit (HOSPITAL_COMMUNITY): Payer: Medicare Other | Admitting: Medical

## 2023-08-31 ENCOUNTER — Ambulatory Visit (HOSPITAL_COMMUNITY): Payer: Medicare Other | Admitting: Anesthesiology

## 2023-08-31 ENCOUNTER — Encounter (HOSPITAL_COMMUNITY)
Admission: RE | Disposition: A | Discharge status: Home / Self Care | Payer: Self-pay | Source: Ambulatory Visit | Attending: Orthopedic Surgery

## 2023-08-31 DIAGNOSIS — J45909 Unspecified asthma, uncomplicated: Secondary | ICD-10-CM | POA: Diagnosis not present

## 2023-08-31 DIAGNOSIS — M1711 Unilateral primary osteoarthritis, right knee: Secondary | ICD-10-CM | POA: Diagnosis not present

## 2023-08-31 DIAGNOSIS — Z85828 Personal history of other malignant neoplasm of skin: Secondary | ICD-10-CM | POA: Diagnosis not present

## 2023-08-31 DIAGNOSIS — G8918 Other acute postprocedural pain: Secondary | ICD-10-CM | POA: Diagnosis not present

## 2023-08-31 DIAGNOSIS — Z79899 Other long term (current) drug therapy: Secondary | ICD-10-CM | POA: Diagnosis not present

## 2023-08-31 DIAGNOSIS — M179 Osteoarthritis of knee, unspecified: Secondary | ICD-10-CM | POA: Diagnosis present

## 2023-08-31 DIAGNOSIS — Z01818 Encounter for other preprocedural examination: Principal | ICD-10-CM

## 2023-08-31 DIAGNOSIS — Z86718 Personal history of other venous thrombosis and embolism: Secondary | ICD-10-CM | POA: Insufficient documentation

## 2023-08-31 HISTORY — PX: TOTAL KNEE ARTHROPLASTY: SHX125

## 2023-08-31 SURGERY — ARTHROPLASTY, KNEE, TOTAL
Anesthesia: Spinal | Site: Knee | Laterality: Right

## 2023-08-31 MED ORDER — APIXABAN 2.5 MG PO TABS
2.5000 mg | ORAL_TABLET | Freq: Two times a day (BID) | ORAL | Status: DC
  Administered 2023-09-01: 2.5 mg via ORAL
  Filled 2023-08-31: qty 1

## 2023-08-31 MED ORDER — METOCLOPRAMIDE HCL 5 MG PO TABS: 5.0000 mg | ORAL_TABLET | Freq: Three times a day (TID) | ORAL | Status: DC | PRN

## 2023-08-31 MED ORDER — BUPIVACAINE HCL (PF) 0.5 % IJ SOLN
INTRAMUSCULAR | Status: DC | PRN
  Administered 2023-08-31: 10 mL

## 2023-08-31 MED ORDER — METHOCARBAMOL 500 MG PO TABS
500.0000 mg | ORAL_TABLET | Freq: Four times a day (QID) | ORAL | Status: DC | PRN
  Filled 2023-08-31: qty 1

## 2023-08-31 MED ORDER — TRANEXAMIC ACID 1000 MG/10ML IV SOLN
INTRAVENOUS | Status: DC | PRN
  Administered 2023-08-31: 2000 mg via TOPICAL

## 2023-08-31 MED ORDER — DIPHENHYDRAMINE HCL 12.5 MG/5ML PO ELIX: 12.5000 mg | ORAL_SOLUTION | ORAL | Status: DC | PRN

## 2023-08-31 MED ORDER — OXYCODONE HCL 5 MG PO TABS
5.0000 mg | ORAL_TABLET | ORAL | Status: DC | PRN
  Administered 2023-09-01: 5 mg via ORAL
  Filled 2023-08-31 (×2): qty 1

## 2023-08-31 MED ORDER — CEFAZOLIN SODIUM-DEXTROSE 2-4 GM/100ML-% IV SOLN
2.0000 g | Freq: Four times a day (QID) | INTRAVENOUS | Status: AC
  Administered 2023-08-31 (×2): 2 g via INTRAVENOUS
  Filled 2023-08-31 (×2): qty 100

## 2023-08-31 MED ORDER — ACETAMINOPHEN 10 MG/ML IV SOLN
1000.0000 mg | Freq: Four times a day (QID) | INTRAVENOUS | Status: DC
  Administered 2023-08-31: 1000 mg via INTRAVENOUS
  Filled 2023-08-31: qty 100

## 2023-08-31 MED ORDER — EPHEDRINE 5 MG/ML INJ
INTRAVENOUS | Status: AC
  Filled 2023-08-31: qty 5

## 2023-08-31 MED ORDER — POLYETHYLENE GLYCOL 3350 17 G PO PACK: 17.0000 g | PACK | Freq: Every day | ORAL | Status: DC | PRN

## 2023-08-31 MED ORDER — EPHEDRINE SULFATE-NACL 50-0.9 MG/10ML-% IV SOSY
PREFILLED_SYRINGE | INTRAVENOUS | Status: DC | PRN
Start: 2023-08-31 — End: 2023-08-31
  Administered 2023-08-31: 10 mg via INTRAVENOUS

## 2023-08-31 MED ORDER — METOCLOPRAMIDE HCL 5 MG/ML IJ SOLN: 5.0000 mg | Freq: Three times a day (TID) | INTRAMUSCULAR | Status: DC | PRN

## 2023-08-31 MED ORDER — SODIUM CHLORIDE 0.9 % IR SOLN
Status: DC | PRN
  Administered 2023-08-31: 1000 mL

## 2023-08-31 MED ORDER — ACETAMINOPHEN 500 MG PO TABS
1000.0000 mg | ORAL_TABLET | Freq: Four times a day (QID) | ORAL | Status: AC
  Administered 2023-08-31 – 2023-09-01 (×4): 1000 mg via ORAL
  Filled 2023-08-31 (×5): qty 2

## 2023-08-31 MED ORDER — OXYCODONE HCL 5 MG PO TABS: 5.0000 mg | ORAL_TABLET | Freq: Once | ORAL | Status: DC | PRN

## 2023-08-31 MED ORDER — DEXAMETHASONE SODIUM PHOSPHATE 10 MG/ML IJ SOLN
8.0000 mg | Freq: Once | INTRAMUSCULAR | Status: AC
  Administered 2023-08-31: 8 mg via INTRAVENOUS

## 2023-08-31 MED ORDER — FENTANYL CITRATE PF 50 MCG/ML IJ SOSY: 25.0000 ug | PREFILLED_SYRINGE | INTRAMUSCULAR | Status: DC | PRN

## 2023-08-31 MED ORDER — TRAMADOL HCL 50 MG PO TABS
50.0000 mg | ORAL_TABLET | Freq: Four times a day (QID) | ORAL | Status: DC | PRN
  Administered 2023-08-31 – 2023-09-01 (×3): 50 mg via ORAL
  Filled 2023-08-31: qty 1
  Filled 2023-08-31: qty 2
  Filled 2023-08-31: qty 1

## 2023-08-31 MED ORDER — METHOCARBAMOL 1000 MG/10ML IJ SOLN: 500.0000 mg | Freq: Four times a day (QID) | INTRAVENOUS | Status: DC | PRN

## 2023-08-31 MED ORDER — ONDANSETRON HCL 4 MG/2ML IJ SOLN
4.0000 mg | Freq: Four times a day (QID) | INTRAMUSCULAR | Status: DC | PRN
  Administered 2023-08-31: 4 mg via INTRAVENOUS
  Filled 2023-08-31: qty 2

## 2023-08-31 MED ORDER — BISACODYL 10 MG RE SUPP: 10.0000 mg | Freq: Every day | RECTAL | Status: DC | PRN

## 2023-08-31 MED ORDER — PROPOFOL 10 MG/ML IV BOLUS
INTRAVENOUS | Status: AC
  Filled 2023-08-31: qty 20

## 2023-08-31 MED ORDER — DOCUSATE SODIUM 100 MG PO CAPS
100.0000 mg | ORAL_CAPSULE | Freq: Two times a day (BID) | ORAL | Status: DC
  Administered 2023-08-31 – 2023-09-01 (×2): 100 mg via ORAL
  Filled 2023-08-31 (×2): qty 1

## 2023-08-31 MED ORDER — TRANEXAMIC ACID 1000 MG/10ML IV SOLN
2000.0000 mg | Freq: Once | INTRAVENOUS | Status: DC
  Filled 2023-08-31: qty 20

## 2023-08-31 MED ORDER — CHLORHEXIDINE GLUCONATE 0.12 % MT SOLN
15.0000 mL | Freq: Once | OROMUCOSAL | Status: AC
  Administered 2023-08-31: 15 mL via OROMUCOSAL

## 2023-08-31 MED ORDER — PROPOFOL 10 MG/ML IV BOLUS
INTRAVENOUS | Status: DC | PRN
Start: 2023-08-31 — End: 2023-08-31
  Administered 2023-08-31: 30 mg via INTRAVENOUS

## 2023-08-31 MED ORDER — ONDANSETRON HCL 4 MG/2ML IJ SOLN
INTRAMUSCULAR | Status: DC | PRN
  Administered 2023-08-31: 4 mg via INTRAVENOUS

## 2023-08-31 MED ORDER — POVIDONE-IODINE 10 % EX SWAB: 2.0000 | Freq: Once | CUTANEOUS | Status: DC

## 2023-08-31 MED ORDER — OXYCODONE HCL 5 MG/5ML PO SOLN: 5.0000 mg | Freq: Once | ORAL | Status: DC | PRN

## 2023-08-31 MED ORDER — LACTATED RINGERS IV SOLN: INTRAVENOUS | Status: DC

## 2023-08-31 MED ORDER — SODIUM CHLORIDE 0.9 % IV SOLN
INTRAVENOUS | Status: DC | PRN
  Administered 2023-08-31: 40 mL

## 2023-08-31 MED ORDER — ONDANSETRON HCL 4 MG/2ML IJ SOLN: 4.0000 mg | Freq: Once | INTRAMUSCULAR | Status: DC | PRN

## 2023-08-31 MED ORDER — ONDANSETRON HCL 4 MG/2ML IJ SOLN
INTRAMUSCULAR | Status: AC
  Filled 2023-08-31: qty 2

## 2023-08-31 MED ORDER — FENTANYL CITRATE PF 50 MCG/ML IJ SOSY
50.0000 ug | PREFILLED_SYRINGE | INTRAMUSCULAR | Status: DC
  Administered 2023-08-31: 50 ug via INTRAVENOUS
  Filled 2023-08-31: qty 2

## 2023-08-31 MED ORDER — ACETAMINOPHEN 325 MG PO TABS: 325.0000 mg | ORAL_TABLET | Freq: Four times a day (QID) | ORAL | Status: DC | PRN

## 2023-08-31 MED ORDER — ONDANSETRON HCL 4 MG PO TABS: 4.0000 mg | ORAL_TABLET | Freq: Four times a day (QID) | ORAL | Status: DC | PRN

## 2023-08-31 MED ORDER — ACETAMINOPHEN 10 MG/ML IV SOLN: 1000.0000 mg | Freq: Once | INTRAVENOUS | Status: DC | PRN

## 2023-08-31 MED ORDER — FLEET ENEMA RE ENEM: 1.0000 | ENEMA | Freq: Once | RECTAL | Status: DC | PRN

## 2023-08-31 MED ORDER — 0.9 % SODIUM CHLORIDE (POUR BTL) OPTIME
TOPICAL | Status: DC | PRN
  Administered 2023-08-31: 1000 mL

## 2023-08-31 MED ORDER — MENTHOL 3 MG MT LOZG: 1.0000 | LOZENGE | OROMUCOSAL | Status: DC | PRN

## 2023-08-31 MED ORDER — SODIUM CHLORIDE (PF) 0.9 % IJ SOLN
INTRAMUSCULAR | Status: AC
  Filled 2023-08-31: qty 50

## 2023-08-31 MED ORDER — DEXAMETHASONE SODIUM PHOSPHATE 10 MG/ML IJ SOLN
INTRAMUSCULAR | Status: AC
  Filled 2023-08-31: qty 1

## 2023-08-31 MED ORDER — BUPIVACAINE IN DEXTROSE 0.75-8.25 % IT SOLN
INTRATHECAL | Status: DC | PRN
Start: 2023-08-31 — End: 2023-08-31
  Administered 2023-08-31: 1.8 mL via INTRATHECAL

## 2023-08-31 MED ORDER — BUPIVACAINE LIPOSOME 1.3 % IJ SUSP
20.0000 mL | Freq: Once | INTRAMUSCULAR | Status: AC
  Administered 2023-08-31: 10 mL

## 2023-08-31 MED ORDER — PHENYLEPHRINE HCL-NACL 20-0.9 MG/250ML-% IV SOLN
INTRAVENOUS | Status: DC | PRN
Start: 2023-08-31 — End: 2023-08-31
  Administered 2023-08-31: 20 ug/min via INTRAVENOUS

## 2023-08-31 MED ORDER — PROPOFOL 500 MG/50ML IV EMUL
INTRAVENOUS | Status: DC | PRN
  Administered 2023-08-31: 100 ug/kg/min via INTRAVENOUS

## 2023-08-31 MED ORDER — CEFAZOLIN SODIUM-DEXTROSE 2-4 GM/100ML-% IV SOLN
2.0000 g | INTRAVENOUS | Status: AC
  Administered 2023-08-31: 2 g via INTRAVENOUS
  Filled 2023-08-31: qty 100

## 2023-08-31 MED ORDER — BUPIVACAINE LIPOSOME 1.3 % IJ SUSP
INTRAMUSCULAR | Status: AC
  Filled 2023-08-31: qty 10

## 2023-08-31 MED ORDER — STERILE WATER FOR IRRIGATION IR SOLN
Status: DC | PRN
  Administered 2023-08-31: 2000 mL

## 2023-08-31 MED ORDER — PROPOFOL 1000 MG/100ML IV EMUL
INTRAVENOUS | Status: AC
  Filled 2023-08-31: qty 100

## 2023-08-31 MED ORDER — SODIUM CHLORIDE 0.9 % IV SOLN: INTRAVENOUS | Status: DC

## 2023-08-31 MED ORDER — MORPHINE SULFATE (PF) 2 MG/ML IV SOLN: 1.0000 mg | INTRAVENOUS | Status: DC | PRN

## 2023-08-31 MED ORDER — DEXAMETHASONE SODIUM PHOSPHATE 10 MG/ML IJ SOLN
10.0000 mg | Freq: Once | INTRAMUSCULAR | Status: AC
  Administered 2023-09-01: 10 mg via INTRAVENOUS
  Filled 2023-08-31: qty 1

## 2023-08-31 MED ORDER — PHENOL 1.4 % MT LIQD: 1.0000 | OROMUCOSAL | Status: DC | PRN

## 2023-08-31 MED ORDER — ORAL CARE MOUTH RINSE: 15.0000 mL | Freq: Once | OROMUCOSAL | Status: AC

## 2023-08-31 SURGICAL SUPPLY — 55 items
ADH SKN CLS APL DERMABOND .7 (GAUZE/BANDAGES/DRESSINGS) ×1
ATTUNE MED DOME PAT 38 KNEE (Knees) IMPLANT
ATTUNE PSFEM RTSZ5 NARCEM KNEE (Femur) IMPLANT
ATTUNE PSRP INSR SZ5 8 KNEE (Insert) IMPLANT
BAG COUNTER SPONGE SURGICOUNT (BAG) IMPLANT
BAG SPEC THK2 15X12 ZIP CLS (MISCELLANEOUS) ×1
BAG SPNG CNTER NS LX DISP (BAG)
BAG ZIPLOCK 12X15 (MISCELLANEOUS) ×2 IMPLANT
BASE TIBIAL ROT PLAT SZ 5 KNEE (Knees) IMPLANT
BLADE SAG 18X100X1.27 (BLADE) ×2 IMPLANT
BLADE SAW SGTL 11.0X1.19X90.0M (BLADE) ×2 IMPLANT
BNDG CMPR 6 X 5 YARDS HK CLSR (GAUZE/BANDAGES/DRESSINGS) ×1
BNDG ELASTIC 6INX 5YD STR LF (GAUZE/BANDAGES/DRESSINGS) ×2 IMPLANT
BOWL SMART MIX CTS (DISPOSABLE) ×2 IMPLANT
BSPLAT TIB 5 CMNT ROT PLAT STR (Knees) ×1 IMPLANT
CEMENT HV SMART SET (Cement) ×4 IMPLANT
COVER SURGICAL LIGHT HANDLE (MISCELLANEOUS) ×2 IMPLANT
CUFF TOURN SGL QUICK 34 (TOURNIQUET CUFF) ×1
CUFF TRNQT CYL 34X4.125X (TOURNIQUET CUFF) ×2 IMPLANT
DERMABOND ADVANCED .7 DNX12 (GAUZE/BANDAGES/DRESSINGS) ×2 IMPLANT
DRAPE U-SHAPE 47X51 STRL (DRAPES) ×2 IMPLANT
DRSG AQUACEL AG ADV 3.5X10 (GAUZE/BANDAGES/DRESSINGS) ×2 IMPLANT
DURAPREP 26ML APPLICATOR (WOUND CARE) ×2 IMPLANT
ELECT REM PT RETURN 15FT ADLT (MISCELLANEOUS) ×2 IMPLANT
GLOVE BIO SURGEON STRL SZ 6.5 (GLOVE) IMPLANT
GLOVE BIO SURGEON STRL SZ8 (GLOVE) ×2 IMPLANT
GLOVE BIOGEL PI IND STRL 6.5 (GLOVE) IMPLANT
GLOVE BIOGEL PI IND STRL 7.0 (GLOVE) IMPLANT
GLOVE BIOGEL PI IND STRL 8 (GLOVE) ×2 IMPLANT
GOWN STRL REUS W/ TWL LRG LVL3 (GOWN DISPOSABLE) ×2 IMPLANT
GOWN STRL REUS W/TWL LRG LVL3 (GOWN DISPOSABLE) ×1
HANDPIECE INTERPULSE COAX TIP (DISPOSABLE) ×1
HOLDER FOLEY CATH W/STRAP (MISCELLANEOUS) IMPLANT
IMMOBILIZER KNEE 20 (SOFTGOODS) ×1
IMMOBILIZER KNEE 20 THIGH 36 (SOFTGOODS) ×2 IMPLANT
KIT TURNOVER KIT A (KITS) IMPLANT
MANIFOLD NEPTUNE II (INSTRUMENTS) ×2 IMPLANT
NS IRRIG 1000ML POUR BTL (IV SOLUTION) ×2 IMPLANT
PACK TOTAL KNEE CUSTOM (KITS) ×2 IMPLANT
PADDING CAST COTTON 6X4 STRL (CAST SUPPLIES) ×4 IMPLANT
PIN STEINMAN FIXATION KNEE (PIN) IMPLANT
PROTECTOR NERVE ULNAR (MISCELLANEOUS) ×2 IMPLANT
SET HNDPC FAN SPRY TIP SCT (DISPOSABLE) ×2 IMPLANT
SPIKE FLUID TRANSFER (MISCELLANEOUS) ×2 IMPLANT
SUT MNCRL AB 4-0 PS2 18 (SUTURE) ×2 IMPLANT
SUT STRATAFIX 0 PDS 27 VIOLET (SUTURE) ×1
SUT VIC AB 2-0 CT1 27 (SUTURE) ×3
SUT VIC AB 2-0 CT1 TAPERPNT 27 (SUTURE) ×6 IMPLANT
SUTURE STRATFX 0 PDS 27 VIOLET (SUTURE) ×2 IMPLANT
TIBIAL BASE ROT PLAT SZ 5 KNEE (Knees) ×1 IMPLANT
TRAY FOLEY MTR SLVR 14FR STAT (SET/KITS/TRAYS/PACK) IMPLANT
TRAY FOLEY MTR SLVR 16FR STAT (SET/KITS/TRAYS/PACK) ×2 IMPLANT
TUBE SUCTION HIGH CAP CLEAR NV (SUCTIONS) ×2 IMPLANT
WATER STERILE IRR 1000ML POUR (IV SOLUTION) ×4 IMPLANT
WRAP KNEE MAXI GEL POST OP (GAUZE/BANDAGES/DRESSINGS) ×2 IMPLANT

## 2023-08-31 NOTE — Anesthesia Procedure Notes (Signed)
Spinal  Patient location during procedure: OR Start time: 08/31/2023 10:43 AM End time: 08/31/2023 10:47 AM Reason for block: surgical anesthesia Staffing Performed: anesthesiologist  Anesthesiologist: Mariann Barter, MD Performed by: Mariann Barter, MD Authorized by: Mariann Barter, MD   Preanesthetic Checklist Completed: patient identified, IV checked, site marked, risks and benefits discussed, surgical consent, monitors and equipment checked, pre-op evaluation and timeout performed Spinal Block Patient position: sitting Prep: Betadine Patient monitoring: heart rate, continuous pulse ox and blood pressure Location: L3-4 Injection technique: single-shot Needle Needle type: Sprotte  Needle gauge: 24 G Needle length: 9 cm Assessment Events: CSF return Additional Notes

## 2023-08-31 NOTE — Anesthesia Procedure Notes (Signed)
Anesthesia Regional Block: Adductor canal block   Pre-Anesthetic Checklist: , timeout performed,  Correct Patient, Correct Site, Correct Laterality,  Correct Procedure, Correct Position, site marked,  Risks and benefits discussed,  Surgical consent,  Pre-op evaluation,  At surgeon's request and post-op pain management  Laterality: Right  Prep: Maximum Sterile Barrier Precautions used, chloraprep       Needles:  Injection technique: Single-shot  Needle Type: Echogenic Needle      Needle Gauge: 20     Additional Needles:   Procedures:,,,, ultrasound used (permanent image in chart),,    Narrative:  Start time: 08/31/2023 10:00 AM End time: 08/31/2023 10:03 AM Injection made incrementally with aspirations every 5 mL.  Performed by: Personally  Anesthesiologist: Mariann Barter, MD

## 2023-08-31 NOTE — H&P (Signed)
TOTAL KNEE ADMISSION H&P  Patient is being admitted for right total knee arthroplasty.  Subjective:  Chief Complaint: Right knee pain.  HPI: Cassandra Underwood, 82 y.o. female has a history of pain and functional disability in the right knee due to arthritis and has failed non-surgical conservative treatments for greater than 12 weeks to include NSAID's and/or analgesics, corticosteriod injections, viscosupplementation injections, and activity modification. Onset of symptoms was gradual, starting 6 years ago with gradually worsening course since that time. The patient noted no past surgery on the right knee.  Patient currently rates pain in the right knee at 8 out of 10 with activity. Patient has worsening of pain with activity and weight bearing, pain that interferes with activities of daily living, and pain with passive range of motion. Patient has evidence of periarticular osteophytes and joint space narrowing by imaging studies. There is no active infection.  Patient Active Problem List   Diagnosis Date Noted   Lateral meniscal tear 08/16/2014    Past Medical History:  Diagnosis Date   Allergy    mild   Arthritis    knee, foot per MD    Asthma    hx of uses no medications   Cancer (HCC)    hx of skin cancer on left leg and right shoulder   Cataract    removed both eyes   DVT of lower extremity (deep venous thrombosis) (HCC)    hx of   Osteopenia    Pneumonia    as a child   PONV (postoperative nausea and vomiting)     Past Surgical History:  Procedure Laterality Date   ABDOMINAL HYSTERECTOMY     ARTHRODESIS METATARSALPHALANGEAL JOINT (MTPJ) Left 05/01/2016   Procedure: LEFT ARTHRODESIS METATARSALPHALANGEAL JOINT (MTPJ) ;  Surgeon: Toni Arthurs, MD;  Location: [facility];  Service: Orthopedics;  Laterality: Left;   bilat foot surgery      CATARACT EXTRACTION, BILATERAL     2005, 2010   CHOLECYSTECTOMY     COLONOSCOPY  2004   dr Marina Goodell    INJECTION KNEE Right  05/01/2016   Procedure: KNEE INJECTION;  Surgeon: Toni Arthurs, MD;  Location: [facility];  Service: Orthopedics;  Laterality: Right;   KNEE ARTHROSCOPY Right 08/16/2014   Procedure: RIGHT ARTHROSCOPY WITH LATERAL KNEE MENISCUS DEBRIDEMENT AND CHONDROPLASTY;  Surgeon: Loanne Drilling, MD;  Location: [facility];  Service: Orthopedics;  Laterality: Right;   tummy tuck      Prior to Admission medications   Medication Sig Start Date End Date Taking? Authorizing Provider  acetaminophen (TYLENOL) 500 MG tablet Take 500 mg by mouth every 6 (six) hours as needed for moderate pain.   Yes [provider]  mupirocin ointment (BACTROBAN) 2 % Apply 1 Application topically daily. 07/14/23  Yes [provider]  Vitamin D, Ergocalciferol, (DRISDOL) 1.25 MG (50000 UNIT) CAPS capsule Take 50,000 Units by mouth every Sunday.   Yes [provider]    Allergies  Allergen Reactions   Shrimp [Shellfish Allergy]     Headache     Social History   Socioeconomic History   Marital status: Married    Spouse name: Not on file   Number of children: Not on file   Years of education: Not on file   Highest education level: Not on file  Occupational History   Not on file  Tobacco Use   Smoking status: Never   Smokeless tobacco: Never  Vaping Use   Vaping status: Never  Used  Substance and Sexual Activity   Alcohol use: Yes    Comment: rare   Drug use: No   Sexual activity: Yes    Birth control/protection: Surgical  Other Topics Concern   Not on file  Social History Narrative   Not on file   Social Determinants of Health   Financial Resource Strain: Not on file  Food Insecurity: Not on file  Transportation Needs: Not on file  Physical Activity: Not on file  Stress: Not on file  Social Connections: Not on file  Intimate Partner Violence: Not on file    Tobacco Use: Low Risk  (08/26/2023)   Patient History    Smoking Tobacco Use: Never    Smokeless Tobacco Use:  Never    Passive Exposure: Not on file   Social History   Substance and Sexual Activity  Alcohol Use Yes   Comment: rare    Family History  Problem Relation Age of Onset   Breast cancer Mother        dies age 74   Colon cancer Neg Hx    Colon polyps Neg Hx    Esophageal cancer Neg Hx    Rectal cancer Neg Hx    Stomach cancer Neg Hx     ROS  Objective:  Physical Exam: - Well-developed female, alert and oriented, no apparent distress.  - Evaluation of her hips shows normal range of motion with no discomfort.  - Left knee shows no effusion. Range of motion is 0-125 with no tenderness or instability.  - Right knee shows a significant valgus deformity. There is no effusion. Range of motion is about 10-120 with moderate crepitus on range of motion. She is tender lateral greater than medial. There is no instability noted about the knee. She has a significantly antalgic gait pattern on the right.   IMAGING:  Radiographs taken today of both knees and lateral of the right knee demonstrate severe bone-on-bone arthritis in the lateral and patellofemoral compartments. There is a significant valgus deformity and large marginal osteophytes in the lateral and patellofemoral compartments. The patellofemoral compartment is also bone-on-bone.  Assessment/Plan:  End stage arthritis, right knee   The patient history, physical examination, clinical judgment of the provider and imaging studies are consistent with end stage degenerative joint disease of the right knee and total knee arthroplasty is deemed medically necessary. The treatment options including medical management, injection therapy arthroscopy and arthroplasty were discussed at length. The risks and benefits of total knee arthroplasty were presented and reviewed. The risks due to aseptic loosening, infection, stiffness, patella tracking problems, thromboembolic complications and other imponderables were discussed. The patient acknowledged  the explanation, agreed to proceed with the plan and consent was signed. Patient is being admitted for inpatient treatment for surgery, pain control, PT, OT, prophylactic antibiotics, VTE prophylaxis, progressive ambulation and ADLs and discharge planning. The patient is planning to be discharged home with OPPT scheduled.   Patient's anticipated LOS is less than 2 midnights, meeting these requirements: - Younger than 7 - Lives within 1 hour of care - Has a competent adult at home to recover with post-op recover - NO history of  - Chronic pain requiring opiods  - Diabetes  - Coronary Artery Disease  - Heart failure  - Heart attack  - Stroke  - DVT/VTE  - Cardiac arrhythmia  - Respiratory Failure/COPD  - Renal failure  - Anemia  - Advanced Liver disease  Therapy Plans: EO Disposition: Home with husband Planned DVT  Prophylaxis: Eliquis 2.5mg  BID (hx DVT LLE 2021) DME Needed: CTU and walker PCP: Dr. Clelia Croft (clearance received) TXA: TOPICAL Allergies: Shellfish (nausea, dizziness) Anesthesia Concerns: Nausea BMI: 23.9 Pharmacy: [facility] on [address]  Other: - Reports recent mohs with infx on RLE, procedure in July, finished abx 2 weeks ago. No signs of active infection today. - Requests SDD, but discussed that I believe she would benefit from a night in the hospital for PT  - Patient was instructed on what medications to stop prior to surgery. - Follow-up visit in 2 weeks with Dr. Lequita Halt - Begin physical therapy following surgery - Pre-operative lab work as pre-surgical testing - Prescriptions will be provided in hospital at time of discharge  Weston Brass, PA-C Orthopedic Surgery [facility] [place]

## 2023-08-31 NOTE — Interval H&P Note (Signed)
History and Physical Interval Note:  08/31/2023 8:30 AM  Cassandra Underwood  has presented today for surgery, with the diagnosis of right knee osteoarthritis.  The various methods of treatment have been discussed with the patient and family. After consideration of risks, benefits and other options for treatment, the patient has consented to  Procedure(s): TOTAL KNEE ARTHROPLASTY (Right) as a surgical intervention.  The patient's history has been reviewed, patient examined, no change in status, stable for surgery.  I have reviewed the patient's chart and labs.  Questions were answered to the patient's satisfaction.     Homero Fellers [name]

## 2023-08-31 NOTE — Anesthesia Preprocedure Evaluation (Signed)
Anesthesia Evaluation  Patient identified by MRN, date of birth, ID band Patient awake    Reviewed: Allergy & Precautions, NPO status , Patient's Chart, lab work & pertinent test results, reviewed documented beta blocker date and time   History of Anesthesia Complications (+) PONV and history of anesthetic complications  Airway Mallampati: III  TM Distance: >3 FB     Dental no notable dental hx.    Pulmonary asthma , neg sleep apnea, pneumonia, neg COPD   breath sounds clear to auscultation       Cardiovascular (-) hypertension(-) angina (-) CAD, (-) Past MI, (-) Cardiac Stents and (-) CABG (-) pacemaker(-) Cardiac Defibrillator (-) Valvular Problems/Murmurs Rhythm:Regular Rate:Normal     Neuro/Psych neg Seizures    GI/Hepatic hiatal hernia,neg GERD  ,,(+) neg Cirrhosis        Endo/Other  neg diabetes    Renal/GU Renal disease     Musculoskeletal  (+) Arthritis ,    Abdominal   Peds  Hematology   Anesthesia Other Findings   Reproductive/Obstetrics                             Anesthesia Physical Anesthesia Plan  ASA: 2  Anesthesia Plan: Spinal   Post-op Pain Management: Regional block*   Induction: Intravenous  PONV Risk Score and Plan: 3 and Propofol infusion and Ondansetron  Airway Management Planned:   Additional Equipment:   Intra-op Plan:   Post-operative Plan: Extubation in OR  Informed Consent: I have reviewed the patients History and Physical, chart, labs and discussed the procedure including the risks, benefits and alternatives for the proposed anesthesia with the patient or authorized representative who has indicated his/her understanding and acceptance.     Dental advisory given  Plan Discussed with: CRNA  Anesthesia Plan Comments:        Anesthesia Quick Evaluation

## 2023-08-31 NOTE — Anesthesia Procedure Notes (Signed)
Procedure Name: MAC Date/Time: 08/31/2023 10:55 AM  Performed by: Laurita Quint, CRNAPre-anesthesia Checklist: Patient identified, Emergency Drugs available, Patient being monitored and Suction available Oxygen Delivery Method: Simple face mask

## 2023-08-31 NOTE — Progress Notes (Signed)
Orthopedic Tech Progress Note Patient Details:  Cassandra Underwood [birth year] [id number]  CPM Right Knee CPM Right Knee: On Right Knee Flexion (Degrees): 10 Right Knee Extension (Degrees): 40  Post Interventions Patient Tolerated: Well  Darleen Crocker 08/31/2023, 12:51 PM

## 2023-08-31 NOTE — Discharge Instructions (Addendum)
Ollen Gross, MD Total Joint Specialist [facility] [place] [address], [address] [place], [place] [address] [phone]  TOTAL KNEE REPLACEMENT POSTOPERATIVE DIRECTIONS    Knee Rehabilitation, Guidelines Following Surgery  Results after knee surgery are often greatly improved when you follow the exercise, range of motion and muscle strengthening exercises prescribed by your doctor. Safety measures are also important to protect the knee from further injury. If any of these exercises cause you to have increased pain or swelling in your knee joint, decrease the amount until you are comfortable again and slowly increase them. If you have problems or questions, call your caregiver or physical therapist for advice.   BLOOD CLOT PREVENTION Take a 2.5 mg Eliquis twice a day for three weeks following surgery. Then take an 81 mg Aspirin once a day for three weeks. Then discontinue Aspirin. You may resume your vitamins/supplements once you have discontinued the Eliquis. Do not take any NSAIDs (Advil, Aleve, Ibuprofen, Meloxicam, etc.) until you have discontinued the Eliquis.   HOME CARE INSTRUCTIONS  Remove items at home which could result in a fall. This includes throw rugs or furniture in walking pathways.  ICE to the affected knee as much as tolerated. Icing helps control swelling. If the swelling is well controlled you will be more comfortable and rehab easier. Continue to use ice on the knee for pain and swelling from surgery. You may notice swelling that will progress down to the foot and ankle. This is normal after surgery. Elevate the leg when you are not up walking on it.    Continue to use the breathing machine which will help keep your temperature down. It is common for your temperature to cycle up and down following surgery, especially at night when you are not up moving around and exerting yourself. The breathing machine keeps your lungs expanded and your temperature  down. Do not place pillow under the operative knee, focus on keeping the knee straight while resting  DIET You may resume your previous home diet once you are discharged from the hospital.  DRESSING / WOUND CARE / SHOWERING Keep your bulky bandage on for 2 days. On the third post-operative day you may remove the Ace bandage and gauze. There is a waterproof adhesive bandage on your skin which will stay in place until your first follow-up appointment. Once you remove this you will not need to place another bandage You may begin showering 3 days following surgery, but do not submerge the incision under water.  ACTIVITY For the first 5 days, the key is rest and control of pain and swelling Do your home exercises twice a day starting on post-operative day 3. On the days you go to physical therapy, just do the home exercises once that day. You should rest, ice and elevate the leg for 50 minutes out of every hour. Get up and walk/stretch for 10 minutes per hour. After 5 days you can increase your activity slowly as tolerated. Walk with your walker as instructed. Use the walker until you are comfortable transitioning to a cane. Walk with the cane in the opposite hand of the operative leg. You may discontinue the cane once you are comfortable and walking steadily. Avoid periods of inactivity such as sitting longer than an hour when not asleep. This helps prevent blood clots.  You may discontinue the knee immobilizer once you are able to perform a straight leg raise while lying down. You may resume a sexual relationship in one month or  when given the OK by your doctor.  You may return to work once you are cleared by your doctor.  Do not drive a car for 6 weeks or until released by your surgeon.  Do not drive while taking narcotics.  TED HOSE STOCKINGS Wear the elastic stockings on both legs for three weeks following surgery during the day. You may remove them at night for sleeping.  WEIGHT  BEARING Weight bearing as tolerated with assist device (walker, cane, etc) as directed, use it as long as suggested by your surgeon or therapist, typically at least 4-6 weeks.  POSTOPERATIVE CONSTIPATION PROTOCOL Constipation - defined medically as fewer than three stools per week and severe constipation as less than one stool per week.  One of the most common issues patients have following surgery is constipation.  Even if you have a regular bowel pattern at home, your normal regimen is likely to be disrupted due to multiple reasons following surgery.  Combination of anesthesia, postoperative narcotics, change in appetite and fluid intake all can affect your bowels.  In order to avoid complications following surgery, here are some recommendations in order to help you during your recovery period.  Colace (docusate) - Pick up an over-the-counter form of Colace or another stool softener and take twice a day as long as you are requiring postoperative pain medications.  Take with a full glass of water daily.  If you experience loose stools or diarrhea, hold the colace until you stool forms back up. If your symptoms do not get better within 1 week or if they get worse, check with your doctor. Dulcolax (bisacodyl) - Pick up over-the-counter and take as directed by the product packaging as needed to assist with the movement of your bowels.  Take with a full glass of water.  Use this product as needed if not relieved by Colace only.  MiraLax (polyethylene glycol) - Pick up over-the-counter to have on hand. MiraLax is a solution that will increase the amount of water in your bowels to assist with bowel movements.  Take as directed and can mix with a glass of water, juice, soda, coffee, or tea. Take if you go more than two days without a movement. Do not use MiraLax more than once per day. Call your doctor if you are still constipated or irregular after using this medication for 7 days in a row.  If you continue  to have problems with postoperative constipation, please contact the office for further assistance and recommendations.  If you experience "the worst abdominal pain ever" or develop nausea or vomiting, please contact the office immediatly for further recommendations for treatment.  ITCHING If you experience itching with your medications, try taking only a single pain pill, or even half a pain pill at a time.  You can also use Benadryl over the counter for itching or also to help with sleep.   MEDICATIONS See your medication summary on the "After Visit Summary" that the nursing staff will review with you prior to discharge.  You may have some home medications which will be placed on hold until you complete the course of blood thinner medication.  It is important for you to complete the blood thinner medication as prescribed by your surgeon.  Continue your approved medications as instructed at time of discharge.  PRECAUTIONS If you experience chest pain or shortness of breath - call 911 immediately for transfer to the hospital emergency department.  If you develop a fever greater that 101 F, purulent  drainage from wound, increased redness or drainage from wound, foul odor from the wound/dressing, or calf pain - CONTACT YOUR SURGEON.                                                   FOLLOW-UP APPOINTMENTS Make sure you keep all of your appointments after your operation with your surgeon and caregivers. You should call the office at the above phone number and make an appointment for approximately two weeks after the date of your surgery or on the date instructed by your surgeon outlined in the "After Visit Summary".  RANGE OF MOTION AND STRENGTHENING EXERCISES  Rehabilitation of the knee is important following a knee injury or an operation. After just a few days of immobilization, the muscles of the thigh which control the knee become weakened and shrink (atrophy). Knee exercises are designed to build up  the tone and strength of the thigh muscles and to improve knee motion. Often times heat used for twenty to thirty minutes before working out will loosen up your tissues and help with improving the range of motion but do not use heat for the first two weeks following surgery. These exercises can be done on a training (exercise) mat, on the floor, on a table or on a bed. Use what ever works the best and is most comfortable for you Knee exercises include:  Leg Lifts - While your knee is still immobilized in a splint or cast, you can do straight leg raises. Lift the leg to 60 degrees, hold for 3 sec, and slowly lower the leg. Repeat 10-20 times 2-3 times daily. Perform this exercise against resistance later as your knee gets better.  Quad and Hamstring Sets - Tighten up the muscle on the front of the thigh (Quad) and hold for 5-10 sec. Repeat this 10-20 times hourly. Hamstring sets are done by pushing the foot backward against an object and holding for 5-10 sec. Repeat as with quad sets.  Leg Slides: Lying on your back, slowly slide your foot toward your buttocks, bending your knee up off the floor (only go as far as is comfortable). Then slowly slide your foot back down until your leg is flat on the floor again. Angel Wings: Lying on your back spread your legs to the side as far apart as you can without causing discomfort.  A rehabilitation program following serious knee injuries can speed recovery and prevent re-injury in the future due to weakened muscles. Contact your doctor or a physical therapist for more information on knee rehabilitation.   POST-OPERATIVE OPIOID TAPER INSTRUCTIONS: It is important to wean off of your opioid medication as soon as possible. If you do not need pain medication after your surgery it is ok to stop day one. Opioids include: Codeine, Hydrocodone(Norco, Vicodin), Oxycodone(Percocet, oxycontin) and hydromorphone amongst others.  Long term and even short term use of opiods can  cause: Increased pain response Dependence Constipation Depression Respiratory depression And more.  Withdrawal symptoms can include Flu like symptoms Nausea, vomiting And more Techniques to manage these symptoms Hydrate well Eat regular healthy meals Stay active Use relaxation techniques(deep breathing, meditating, yoga) Do Not substitute Alcohol to help with tapering If you have been on opioids for less than two weeks and do not have pain than it is ok to stop all together.  Plan to  wean off of opioids This plan should start within one week post op of your joint replacement. Maintain the same interval or time between taking each dose and first decrease the dose.  Cut the total daily intake of opioids by one tablet each day Next start to increase the time between doses. The last dose that should be eliminated is the evening dose.   IF YOU ARE TRANSFERRED TO A SKILLED REHAB FACILITY If the patient is transferred to a skilled rehab facility following release from the hospital, a list of the current medications will be sent to the facility for the patient to continue.  When discharged from the skilled rehab facility, please have the facility set up the patient's Home Health Physical Therapy prior to being released. Also, the skilled facility will be responsible for providing the patient with their medications at time of release from the facility to include their pain medication, the muscle relaxants, and their blood thinner medication. If the patient is still at the rehab facility at time of the two week follow up appointment, the skilled rehab facility will also need to assist the patient in arranging follow up appointment in our office and any transportation needs.  MAKE SURE YOU:  Understand these instructions.  Get help right away if you are not doing well or get worse.   DENTAL ANTIBIOTICS:  In most cases prophylactic antibiotics for Dental procdeures after total joint surgery are  not necessary.  Exceptions are as follows:  1. History of prior total joint infection  2. Severely immunocompromised (Organ Transplant, cancer chemotherapy, Rheumatoid biologic medications such as Humera)  3. Poorly controlled diabetes (A1C &gt; 8.0, blood glucose over 200)  If you have one of these conditions, contact your surgeon for an antibiotic prescription, prior to your dental procedure.    Pick up stool softner and laxative for home use following surgery while on pain medications. Do not submerge incision under water. Please use good hand washing techniques while changing dressing each day. May shower starting three days after surgery. Please use a clean towel to pat the incision dry following showers. Continue to use ice for pain and swelling after surgery. Do not use any lotions or creams on the incision until instructed by your surgeon.  ++++++++++++++++++++++++++++++++++++++++++++++++++++++++++++++++++++++++++  Information on my medicine - ELIQUIS (apixaban)  Why was Eliquis prescribed for you? Eliquis was prescribed for you to reduce the risk of blood clots forming after orthopedic surgery.    What do You need to know about Eliquis? Take your Eliquis TWICE DAILY - one tablet in the morning and one tablet in the evening with or without food.  It would be best to take the dose about the same time each day.  If you have difficulty swallowing the tablet whole please discuss with your pharmacist how to take the medication safely.  Take Eliquis exactly as prescribed by your doctor and DO NOT stop taking Eliquis without talking to the doctor who prescribed the medication.  Stopping without other medication to take the place of Eliquis may increase your risk of developing a clot.  After discharge, you should have regular check-up appointments with your healthcare provider that is prescribing your Eliquis.  What do you do if you miss a dose? If a dose of ELIQUIS is  not taken at the scheduled time, take it as soon as possible on the same day and twice-daily administration should be resumed.  The dose should not be doubled to make up for a missed  dose.  Do not take more than one tablet of ELIQUIS at the same time.  Important Safety Information A possible side effect of Eliquis is bleeding. You should call your healthcare provider right away if you experience any of the following: Bleeding from an injury or your nose that does not stop. Unusual colored urine (red or dark brown) or unusual colored stools (red or black). Unusual bruising for unknown reasons. A serious fall or if you hit your head (even if there is no bleeding).  Some medicines may interact with Eliquis and might increase your risk of bleeding or clotting while on Eliquis. To help avoid this, consult your healthcare provider or pharmacist prior to using any new prescription or non-prescription medications, including herbals, vitamins, non-steroidal anti-inflammatory drugs (NSAIDs) and supplements.  This website has more information on Eliquis (apixaban): http://www.eliquis.com/eliquis/home   +++++++++++++++++++++++++++++++++++++++++++++++++++++++++++++++++++++++++++

## 2023-08-31 NOTE — Transfer of Care (Signed)
Immediate Anesthesia Transfer of Care Note  Patient: Cassandra Underwood  Procedure(s) Performed: TOTAL KNEE ARTHROPLASTY (Right: Knee)  Patient Location: PACU  Anesthesia Type:Spinal  Level of Consciousness: drowsy  Airway & Oxygen Therapy: Patient Spontanous Breathing and Patient connected to face mask  Post-op Assessment: Report given to RN and Post -op Vital signs reviewed and stable  Post vital signs: Reviewed and stable  Last Vitals:  Vitals Value Taken Time  BP 127/74 08/31/23 1216  Temp    Pulse 76 08/31/23 1217  Resp 16 08/31/23 1217  SpO2 100 % 08/31/23 1217  Vitals shown include unfiled device data.  Last Pain:  Vitals:   08/31/23 0854  TempSrc: Oral  PainSc:          Complications: No notable events documented.

## 2023-08-31 NOTE — Care Plan (Signed)
Ortho Bundle Case Management Note  Patient Details  Name: Cassandra Underwood MRN: [id number] Date of Birth: [birth year]  R TKA on 08-31-23 DCP:  Home with husband DME:  RW ordered through [facility] PT:  [facility] on 09-03-23                   DME Arranged:  Dan Humphreys rolling DME Agency:  [employer]  HH Arranged:  NA HH Agency:  NA  Additional Comments: Please contact me with any questions of if this plan should need to change.  Ennis Forts, RN,CCM [facility]  [phone] 08/31/2023, 9:56 AM

## 2023-08-31 NOTE — Anesthesia Postprocedure Evaluation (Signed)
Anesthesia Post Note  Patient: Cassandra Underwood  Procedure(s) Performed: TOTAL KNEE ARTHROPLASTY (Right: Knee)     Patient location during evaluation: PACU Anesthesia Type: Spinal Level of consciousness: oriented and awake and alert Pain management: pain level controlled Vital Signs Assessment: post-procedure vital signs reviewed and stable Respiratory status: spontaneous breathing, respiratory function stable and patient connected to nasal cannula oxygen Cardiovascular status: blood pressure returned to baseline and stable Postop Assessment: no headache, no backache and no apparent nausea or vomiting Anesthetic complications: no   No notable events documented.  Last Vitals:  Vitals:   08/31/23 1300 08/31/23 1315  BP: 121/68 133/73  Pulse: 79 75  Resp: 13 13  Temp:    SpO2: 97% 95%    Last Pain:  Vitals:   08/31/23 1315  TempSrc:   PainSc: 0-No pain      LLE Sensation: Numbness (08/31/23 1315)   RLE Sensation: Numbness (08/31/23 1315) L Sensory Level: S1-Sole of foot, small toes (08/31/23 1315) R Sensory Level: L4-Anterior knee, lower leg (08/31/23 1315)  Mariann Barter

## 2023-08-31 NOTE — Evaluation (Signed)
Physical Therapy Evaluation Patient Details Name: Cassandra Underwood MRN: [id number] DOB: March 18, 1941 Today's Date: 08/31/2023  History of Present Illness  82 yo female s/p R TKA on 08/31/23. PMH: arthrodesis L MTP joint, bil LE DVT  Clinical Impression  Pt is s/p TKA resulting in the deficits listed below (see PT Problem List).   Pt amb 35' with RW and min assist to CGA; anticipate steady progress in acute setting.    Pt will benefit from acute skilled PT to increase their independence and safety with mobility to allow discharge.          If plan is discharge home, recommend the following: A little help with bathing/dressing/bathroom;Assist for transportation;Help with stairs or ramp for entrance;Assistance with cooking/housework   Can travel by private vehicle        Equipment Recommendations Rolling walker (2 wheels)  Recommendations for Other Services       Functional Status Assessment Patient has had a recent decline in their functional status and demonstrates the ability to make significant improvements in function in a reasonable and predictable amount of time.     Precautions / Restrictions Precautions Precautions: Fall Required Braces or Orthoses: Knee Immobilizer - Right Knee Immobilizer - Right: Discontinue once straight leg raise with < 10 degree lag Restrictions Weight Bearing Restrictions: No Other Position/Activity Restrictions: WBAT      Mobility  Bed Mobility Overal bed mobility: Needs Assistance Bed Mobility: Supine to Sit     Supine to sit: Contact guard     General bed mobility comments: for safety    Transfers Overall transfer level: Needs assistance Equipment used: Rolling walker (2 wheels) Transfers: Sit to/from Stand Sit to Stand: Contact guard assist           General transfer comment: cues for hand placement and RLE position    Ambulation/Gait Ambulation/Gait assistance: Contact guard assist Gait Distance (Feet): 35 Feet Assistive  device: Rolling walker (2 wheels) Gait Pattern/deviations: Step-to pattern, Decreased stance time - right       General Gait Details: cues for sequence, RW position  Stairs            Wheelchair Mobility     Tilt Bed    Modified Rankin (Stroke Patients Only)       Balance Overall balance assessment: No apparent balance deficits (not formally assessed)                                           Pertinent Vitals/Pain Pain Assessment Pain Assessment: 0-10 Pain Score: 5  Pain Location: right knee Pain Descriptors / Indicators: Aching Pain Intervention(s): Limited activity within patient's tolerance, Monitored during session    Home Living Family/patient expects to be discharged to:: Private residence Living Arrangements: Spouse/significant other Available Help at Discharge: Family;Friend(s);Available 24 hours/day Type of Home: House Home Access: Ramped entrance       Home Layout: One level;Able to live on main level with bedroom/bathroom Home Equipment: None      Prior Function Prior Level of Function : Independent/Modified Independent                     Extremity/Trunk Assessment   Upper Extremity Assessment Upper Extremity Assessment: Overall WFL for tasks assessed    Lower Extremity Assessment Lower Extremity Assessment: RLE deficits/detail RLE Deficits / Details: ankle WFL,  knee and hip grossly 3/5, limited  by post op pain       Communication   Communication Communication: No apparent difficulties  Cognition Arousal: Alert Behavior During Therapy: WFL for tasks assessed/performed Overall Cognitive Status: Within Functional Limits for tasks assessed                                          General Comments      Exercises Total Joint Exercises Ankle Circles/Pumps: AROM, 10 reps, Both   Assessment/Plan    PT Assessment Patient needs continued PT services  PT Problem List Decreased  strength;Pain;Decreased range of motion;Decreased activity tolerance;Decreased balance;Decreased mobility;Decreased knowledge of precautions;Decreased knowledge of use of DME       PT Treatment Interventions DME instruction;Gait training;Functional mobility training;Therapeutic activities;Therapeutic exercise;Patient/family education    PT Goals (Current goals can be found in the Care Plan section)  Acute Rehab PT Goals PT Goal Formulation: With patient Time For Goal Achievement: 09/07/23 Potential to Achieve Goals: Good    Frequency 7X/week     Co-evaluation               AM-PAC PT "6 Clicks" Mobility  Outcome Measure Help needed turning from your back to your side while in a flat bed without using bedrails?: A Little Help needed moving from lying on your back to sitting on the side of a flat bed without using bedrails?: A Little Help needed moving to and from a bed to a chair (including a wheelchair)?: A Little Help needed standing up from a chair using your arms (e.g., wheelchair or bedside chair)?: A Little Help needed to walk in hospital room?: A Little Help needed climbing 3-5 steps with a railing? : A Little 6 Click Score: 18    End of Session Equipment Utilized During Treatment: Gait belt Activity Tolerance: Patient tolerated treatment well Patient left: with call bell/phone within reach;in chair;with chair alarm set Nurse Communication: Mobility status PT Visit Diagnosis: Other abnormalities of gait and mobility (R26.89);Difficulty in walking, not elsewhere classified (R26.2)    Time: 4540-9811 PT Time Calculation (min) (ACUTE ONLY): 24 min   Charges:   PT Evaluation $PT Eval Low Complexity: 1 Low PT Treatments $Gait Training: 8-22 mins PT General Charges $$ ACUTE PT VISIT: 1 Visit         Syretta Kochel, PT  Acute Rehab Dept [facility]) [phone]  08/31/2023   [facility] 08/31/2023, 4:53 PM

## 2023-08-31 NOTE — Progress Notes (Signed)
Orthopedic Tech Progress Note Patient Details:  Cassandra Underwood Sep 09, 1941 [id number]  CPM Right Knee CPM Right Knee: Off Right Knee Flexion (Degrees): 10 Right Knee Extension (Degrees): 40  Post Interventions Patient Tolerated: Well Came to take patient out of CPM and machine was stopped at 10 degrees and not running, patient unsure how long her leg had been sitting still, patients bed also not flat at the end of the bed which is needed for the CPM to sit correctly.  Darleen Crocker 08/31/2023, 4:29 PM

## 2023-08-31 NOTE — Op Note (Signed)
OPERATIVE REPORT-TOTAL KNEE ARTHROPLASTY   Pre-operative diagnosis- Osteoarthritis  Right knee(s)  Post-operative diagnosis- Osteoarthritis Right knee(s)  Procedure-  Right  Total Knee Arthroplasty  Surgeon- Gus Rankin. Chavon Lucarelli, MD  Assistant- Weston Brass, PA-C   Anesthesia-   Adductor canal block and spinal  EBL- 25 ml   Drains None  Tourniquet time-  Total Tourniquet Time Documented: Thigh (Right) - 34 minutes Total: Thigh (Right) - 34 minutes     Complications- None  Condition-PACU - hemodynamically stable.   Brief Clinical Note  Cassandra Underwood is a 82 y.o. year old female with end stage OA of her right knee with progressively worsening pain and dysfunction. She has constant pain, with activity and at rest and significant functional deficits with difficulties even with ADLs. She has had extensive non-op management including analgesics, injections of cortisone and viscosupplements, and home exercise program, but remains in significant pain with significant dysfunction.Radiographs show bone on bone arthritis lateral and patellofemoral with valgus deformity. She presents now for right Total Knee Arthroplasty.     Procedure in detail---   The patient is brought into the operating room and positioned supine on the operating table. After successful administration of  Adductor canal block and spinal,   a tourniquet is placed high on the  Right thigh(s) and the lower extremity is prepped and draped in the usual sterile fashion. Time out is performed by the operating team and then the  Right lower extremity is wrapped in Esmarch, knee flexed and the tourniquet inflated to 300 mmHg.       A midline incision is made with a ten blade through the subcutaneous tissue to the level of the extensor mechanism. A fresh blade is used to make a medial parapatellar arthrotomy. Soft tissue over the proximal medial tibia is subperiosteally elevated to the joint line with a knife and into the  semimembranosus bursa with a Cobb elevator. Soft tissue over the proximal lateral tibia is elevated with attention being paid to avoiding the patellar tendon on the tibial tubercle. The patella is everted, knee flexed 90 degrees and the ACL and PCL are removed. Findings are bone on bone lateral and patellofemoral with large global osteophytes        The drill is used to create a starting hole in the distal femur and the canal is thoroughly irrigated with sterile saline to remove the fatty contents. The 5 degree Right valgus alignment guide is placed into the femoral canal and the distal femoral cutting block is pinned to remove 9 mm off the distal femur. Resection is made with an oscillating saw.      The tibia is subluxed forward and the menisci are removed. The extramedullary alignment guide is placed referencing proximally at the medial aspect of the tibial tubercle and distally along the second metatarsal axis and tibial crest. The block is pinned to remove 2mm off the more deficient lateral  side. Resection is made with an oscillating saw. Size 5is the most appropriate size for the tibia and the proximal tibia is prepared with the modular drill and keel punch for that size.      The femoral sizing guide is placed and size 5 is most appropriate. Rotation is marked off the epicondylar axis and confirmed by creating a rectangular flexion gap at 90 degrees. The size 5 cutting block is pinned in this rotation and the anterior, posterior and chamfer cuts are made with the oscillating saw. The intercondylar block is then placed and that  cut is made.      Trial size 5 tibial component, trial size 5 narrow posterior stabilized femur and a 8  mm posterior stabilized rotating platform insert trial is placed. Full extension is achieved with excellent varus/valgus and anterior/posterior balance throughout full range of motion. The patella is everted and thickness measured to be 22  mm. Free hand resection is taken to 12  mm, a 38 template is placed, lug holes are drilled, trial patella is placed, and it tracks normally. Osteophytes are removed off the posterior femur with the trial in place. All trials are removed and the cut bone surfaces prepared with pulsatile lavage. Cement is mixed and once ready for implantation, the size 5 tibial implant, size  5 narrow posterior stabilized femoral component, and the size 38 patella are cemented in place and the patella is held with the clamp. The trial insert is placed and the knee held in full extension. The Exparel (20 ml mixed with 60 ml saline) is injected into the extensor mechanism, posterior capsule, medial and lateral gutters and subcutaneous tissues.  All extruded cement is removed and once the cement is hard the permanent 8 mm posterior stabilized rotating platform insert is placed into the tibial tray.      The wound is copiously irrigated with saline solution and the extensor mechanism closed with # 0 Stratofix suture. The tourniquet is released for a total tourniquet time of 34  minutes. Flexion against gravity is 140 degrees and the patella tracks normally. Subcutaneous tissue is closed with 2.0 vicryl and subcuticular with running 4.0 Monocryl. The incision is cleaned and dried and steri-strips and a bulky sterile dressing are applied. The limb is placed into a knee immobilizer and the patient is awakened and transported to recovery in stable condition.      Please note that a surgical assistant was a medical necessity for this procedure in order to perform it in a safe and expeditious manner. Surgical assistant was necessary to retract the ligaments and vital neurovascular structures to prevent injury to them and also necessary for proper positioning of the limb to allow for anatomic placement of the prosthesis.   Gus Rankin Jaydalynn Olivero, MD    08/31/2023, 11:49 AM

## 2023-09-01 ENCOUNTER — Encounter (HOSPITAL_COMMUNITY): Payer: Self-pay | Admitting: Orthopedic Surgery

## 2023-09-01 DIAGNOSIS — M1711 Unilateral primary osteoarthritis, right knee: Secondary | ICD-10-CM | POA: Diagnosis not present

## 2023-09-01 DIAGNOSIS — Z79899 Other long term (current) drug therapy: Secondary | ICD-10-CM | POA: Diagnosis not present

## 2023-09-01 DIAGNOSIS — J45909 Unspecified asthma, uncomplicated: Secondary | ICD-10-CM | POA: Diagnosis not present

## 2023-09-01 DIAGNOSIS — Z85828 Personal history of other malignant neoplasm of skin: Secondary | ICD-10-CM | POA: Diagnosis not present

## 2023-09-01 DIAGNOSIS — Z86718 Personal history of other venous thrombosis and embolism: Secondary | ICD-10-CM | POA: Diagnosis not present

## 2023-09-01 LAB — CBC
HCT: 36.2 % (ref 36.0–46.0)
Hemoglobin: 11.5 g/dL — ABNORMAL LOW (ref 12.0–15.0)
MCH: 29.1 pg (ref 26.0–34.0)
MCHC: 31.8 g/dL (ref 30.0–36.0)
MCV: 91.6 fL (ref 80.0–100.0)
Platelets: 219 10*3/uL (ref 150–400)
RBC: 3.95 MIL/uL (ref 3.87–5.11)
RDW: 12.8 % (ref 11.5–15.5)
WBC: 10.9 10*3/uL — ABNORMAL HIGH (ref 4.0–10.5)
nRBC: 0 % (ref 0.0–0.2)

## 2023-09-01 LAB — BASIC METABOLIC PANEL
Anion gap: 7 (ref 5–15)
Anion gap: 8 (ref 5–15)
BUN: 17 mg/dL (ref 8–23)
BUN: 12 mg/dL (ref 8–23)
CO2: 25 mmol/L (ref 22–32)
CO2: 23 mmol/L (ref 22–32)
Calcium: 8.2 mg/dL — ABNORMAL LOW (ref 8.9–10.3)
Calcium: 8 mg/dL — ABNORMAL LOW (ref 8.9–10.3)
Chloride: 106 mmol/L (ref 98–111)
Chloride: 105 mmol/L (ref 98–111)
Creatinine, Ser: 0.66 mg/dL (ref 0.44–1.00)
Creatinine, Ser: 0.69 mg/dL (ref 0.44–1.00)
GFR, Estimated: >60 mL/min (ref >60)
GFR, Estimated: >60 mL/min (ref >60)
Glucose, Bld: 92 mg/dL (ref 70–99)
Glucose, Bld: 139 mg/dL — ABNORMAL HIGH (ref 70–99)
Potassium: 3.8 mmol/L (ref 3.5–5.1)
Potassium: 4.1 mmol/L (ref 3.5–5.1)
Sodium: 138 mmol/L (ref 135–145)
Sodium: 136 mmol/L (ref 135–145)

## 2023-09-01 MED ORDER — METHOCARBAMOL 500 MG PO TABS: 500.0000 mg | ORAL_TABLET | Freq: Four times a day (QID) | ORAL | 0 refills | Status: AC | PRN

## 2023-09-01 MED ORDER — APIXABAN 2.5 MG PO TABS: 2.5000 mg | ORAL_TABLET | Freq: Two times a day (BID) | ORAL | 0 refills | Status: AC

## 2023-09-01 MED ORDER — ONDANSETRON HCL 4 MG PO TABS: 4.0000 mg | ORAL_TABLET | Freq: Four times a day (QID) | ORAL | 0 refills | Status: AC | PRN

## 2023-09-01 MED ORDER — OXYCODONE HCL 5 MG PO TABS: 5.0000 mg | ORAL_TABLET | Freq: Four times a day (QID) | ORAL | 0 refills | Status: AC | PRN

## 2023-09-01 MED ORDER — TRAMADOL HCL 50 MG PO TABS: 50.0000 mg | ORAL_TABLET | Freq: Four times a day (QID) | ORAL | 0 refills | Status: AC | PRN

## 2023-09-01 NOTE — TOC Transition Note (Signed)
Transition of Care [facility]) - CM/SW Discharge Note  Patient Details  Name: Cassandra Underwood MRN: [id number] Date of Birth: 22-Apr-1941  Transition of [facility]) CM/SW Contact:  Ewing Schlein, LCSW Phone Number: 09/01/2023, 10:14 AM  Clinical Narrative: Patient is expected to discharge home after working with PT. CSW met with patient to confirm discharge plan and needs. Patient will go home with OPPT at [facility] with the first appointment scheduled for 09/03/23. Patient will need a rolling walker, which [employer] delivered to patient's room. TOC signing off.    Final next level of care: OP Rehab Barriers to Discharge: No Barriers Identified  Patient Goals and CMS Choice CMS Medicare.gov Compare Post Acute Care list provided to:: Patient Choice offered to / list presented to : Patient  Discharge Plan and Services Additional resources added to the After Visit Summary for         DME Arranged: Walker rolling DME Agency: [employer] Representative spoke with at DME Agency: Prearranged in orthopedist's office HH Arranged: NA HH Agency: NA  Social Determinants of Health (SDOH) Interventions SDOH Screenings   Food Insecurity: No Food Insecurity (08/31/2023)  Housing: Low Risk  (08/31/2023)  Transportation Needs: No Transportation Needs (08/31/2023)  Utilities: Not At Risk (08/31/2023)  Tobacco Use: Low Risk  (08/31/2023)   Readmission Risk Interventions     No data to display

## 2023-09-01 NOTE — Care Management Obs Status (Signed)
MEDICARE OBSERVATION STATUS NOTIFICATION   Patient Details  Name: Cassandra Underwood MRN: [id number] Date of Birth: 1941/08/12   Medicare Observation Status Notification Given:  Yes    Ewing Schlein, LCSW 09/01/2023, 12:53 PM

## 2023-09-01 NOTE — Progress Notes (Signed)
Subjective: 1 Day Post-Op Procedure(s) (LRB): TOTAL KNEE ARTHROPLASTY (Right) Patient reports pain as mild.   Patient seen in rounds by Dr. Lequita Halt. Patient is well, and has had no acute complaints or problems No issues overnight. Denies chest pain, SOB, or calf pain. Foley catheter removed this AM.  We will continue therapy today, ambulated 35' yesterday.   Objective: Vital signs in last 24 hours: Temp:  [97.3 F (36.3 C)-98.2 F (36.8 C)] 98.2 F (36.8 C) (09/10 0556) Pulse Rate:  [67-86] 67 (09/10 0556) Resp:  [10-17] 16 (09/10 0556) BP: (103-166)/(58-90) 106/68 (09/10 0556) SpO2:  [94 %-100 %] 96 % (09/10 0556) Weight:  [60.8 kg] 60.8 kg (09/09 0850)  Intake/Output from previous day:  Intake/Output Summary (Last 24 hours) at 09/01/2023 0836 Last data filed at 09/01/2023 0615 Gross per 24 hour  Intake 3215.76 ml  Output 3050 ml  Net 165.76 ml     Intake/Output this shift: No intake/output data recorded.  Labs: Recent Labs    09/01/23 0309  HGB 11.5*   Recent Labs    09/01/23 0309  WBC 10.9*  RBC 3.95  HCT 36.2  PLT 219   Recent Labs    08/31/23 1235 09/01/23 0309  NA 138 136  K 3.8 4.1  CL 106 105  CO2 25 23  BUN 17 12  CREATININE 0.66 0.69  GLUCOSE 92 139*  CALCIUM 8.2* 8.0*   No results for input(s): "LABPT", "INR" in the last 72 hours.  Exam: General - Patient is Alert and Oriented Extremity - Neurologically intact Neurovascular intact Sensation intact distally Dorsiflexion/Plantar flexion intact Dressing - dressing C/D/I Motor Function - intact, moving foot and toes well on exam.   Past Medical History:  Diagnosis Date   Allergy    mild   Arthritis    knee, foot per MD    Asthma    hx of uses no medications   Cancer (HCC)    hx of skin cancer on left leg and right shoulder   Cataract    removed both eyes   DVT of lower extremity (deep venous thrombosis) (HCC)    hx of   Osteopenia    Pneumonia    as a child   PONV  (postoperative nausea and vomiting)     Assessment/Plan: 1 Day Post-Op Procedure(s) (LRB): TOTAL KNEE ARTHROPLASTY (Right) Principal Problem:   OA (osteoarthritis) of knee Active Problems:   Osteoarthritis of right knee  Estimated body mass index is 23.36 kg/m as calculated from the following:   Height as of this encounter: 5' 3.5" (1.613 m).   Weight as of this encounter: 60.8 kg. Advance diet Up with therapy D/C IV fluids   Patient's anticipated LOS is less than 2 midnights, meeting these requirements: - Younger than 26 - Lives within 1 hour of care - Has a competent adult at home to recover with post-op recover - NO history of  - Chronic pain requiring opiods  - Diabetes  - Coronary Artery Disease  - Heart failure  - Heart attack  - Stroke  - DVT/VTE  - Cardiac arrhythmia  - Respiratory Failure/COPD  - Renal failure  - Anemia  - Advanced Liver disease     DVT Prophylaxis - Eliquis Weight bearing as tolerated. Continue therapy.  Plan is to go Home after hospital stay. Plan for discharge later today if progresses with therapy and meeting goals. Scheduled for OPPT at [facility]. Follow-up in the office in 2 weeks.  The PDMP database  was reviewed today prior to any opioid medications being prescribed to this patient.  Arther Abbott, PA-C Orthopedic Surgery [phone] 09/01/2023, 8:36 AM

## 2023-09-01 NOTE — Progress Notes (Signed)
Physical Therapy Treatment Patient Details Name: Cassandra Underwood MRN: [id number] DOB: 08/10/1941 Today's Date: 09/01/2023   History of Present Illness 82 yo female s/p R TKA on 08/31/23. PMH: arthrodesis L MTP joint, bil LE DVT    PT Comments  Pt is making excellent progress, meeting PT goals. Pt is ready for d/c from PT standpoint with family assist as needed     If plan is discharge home, recommend the following: A little help with bathing/dressing/bathroom;Assist for transportation;Help with stairs or ramp for entrance;Assistance with cooking/housework   Can travel by private vehicle        Equipment Recommendations  Rolling walker (2 wheels)    Recommendations for Other Services       Precautions / Restrictions Precautions Precautions: Fall;Knee Knee Immobilizer - Right: Discontinue once straight leg raise with < 10 degree lag Restrictions Weight Bearing Restrictions: No Other Position/Activity Restrictions: WBAT     Mobility  Bed Mobility               General bed mobility comments: in recliner and returned to same    Transfers Overall transfer level: Needs assistance Equipment used: Rolling walker (2 wheels) Transfers: Sit to/from Stand Sit to Stand: Supervision           General transfer comment: cues for hand placement and RLE position    Ambulation/Gait Ambulation/Gait assistance: Supervision, Contact guard assist Gait Distance (Feet): 80 Feet Assistive device: Rolling walker (2 wheels) Gait Pattern/deviations: Step-to pattern, Decreased stance time - right, Step-through pattern       General Gait Details: cues for sequence, RW position, progression to step through pattern   Stairs             Wheelchair Mobility     Tilt Bed    Modified Rankin (Stroke Patients Only)       Balance Overall balance assessment: No apparent balance deficits (not formally assessed)                                           Cognition Arousal: Alert Behavior During Therapy: WFL for tasks assessed/performed Overall Cognitive Status: Within Functional Limits for tasks assessed                                          Exercises Total Joint Exercises Ankle Circles/Pumps: AROM, 10 reps, Both Quad Sets: AROM, 5 reps, Both Heel Slides: AAROM, Right, 10 reps Straight Leg Raises: AROM, AAROM, Strengthening, Right, 5 reps Knee Flexion: AROM, AAROM, Right, 5 reps, Seated    General Comments        Pertinent Vitals/Pain Pain Assessment Pain Assessment: 0-10 Pain Score: 4  Pain Location: right knee Pain Descriptors / Indicators: Aching, Discomfort, Sore Pain Intervention(s): Limited activity within patient's tolerance, Monitored during session, Premedicated before session, Repositioned, Ice applied    Home Living                          Prior Function            PT Goals (current goals can now be found in the care plan section) Acute Rehab PT Goals PT Goal Formulation: With patient Time For Goal Achievement: 09/07/23 Potential to Achieve Goals: Good Progress towards PT goals: Progressing  toward goals    Frequency    7X/week      PT Plan      Co-evaluation              AM-PAC PT "6 Clicks" Mobility   Outcome Measure  Help needed turning from your back to your side while in a flat bed without using bedrails?: A Little Help needed moving from lying on your back to sitting on the side of a flat bed without using bedrails?: A Little Help needed moving to and from a bed to a chair (including a wheelchair)?: A Little Help needed standing up from a chair using your arms (e.g., wheelchair or bedside chair)?: A Little Help needed to walk in hospital room?: A Little Help needed climbing 3-5 steps with a railing? : A Little 6 Click Score: 18    End of Session Equipment Utilized During Treatment: Gait belt Activity Tolerance: Patient tolerated treatment  well Patient left: with call bell/phone within reach;in chair;with chair alarm set Nurse Communication: Mobility status PT Visit Diagnosis: Other abnormalities of gait and mobility (R26.89);Difficulty in walking, not elsewhere classified (R26.2)     Time: 1610-9604 PT Time Calculation (min) (ACUTE ONLY): 36 min  Charges:    $Gait Training: 8-22 mins $Therapeutic Exercise: 8-22 mins PT General Charges $$ ACUTE PT VISIT: 1 Visit                     Paislea Hatton, PT  Acute Rehab Dept [facility]) [phone]  09/01/2023    [facility] 09/01/2023, 11:24 AM

## 2023-09-03 DIAGNOSIS — M25561 Pain in right knee: Secondary | ICD-10-CM | POA: Diagnosis not present

## 2023-09-07 DIAGNOSIS — M25561 Pain in right knee: Secondary | ICD-10-CM | POA: Diagnosis not present

## 2023-09-08 NOTE — Discharge Summary (Signed)
Patient ID: Cassandra Underwood MRN: [id number] DOB/AGE: [birth year] 82 y.o.  Admit date: 08/31/2023 Discharge date: 09/01/2023  Admission Diagnoses:  Principal Problem:   OA (osteoarthritis) of knee Active Problems:   Osteoarthritis of right knee   Discharge Diagnoses:  Same  Past Medical History:  Diagnosis Date   Allergy    mild   Arthritis    knee, foot per MD    Asthma    hx of uses no medications   Cancer (HCC)    hx of skin cancer on left leg and right shoulder   Cataract    removed both eyes   DVT of lower extremity (deep venous thrombosis) (HCC)    hx of   Osteopenia    Pneumonia    as a child   PONV (postoperative nausea and vomiting)     Surgeries: Procedure(s): TOTAL KNEE ARTHROPLASTY on 08/31/2023   Consultants:   Discharged Condition: Improved  Hospital Course: Cassandra Underwood is an 82 y.o. female who was admitted 08/31/2023 for operative treatment ofOA (osteoarthritis) of knee. Patient has severe unremitting pain that affects sleep, daily activities, and work/hobbies. After pre-op clearance the patient was taken to the operating room on 08/31/2023 and underwent  Procedure(s): TOTAL KNEE ARTHROPLASTY.    Patient was given perioperative antibiotics:  Anti-infectives (From admission, onward)    Start     Dose/Rate Route Frequency Ordered Stop   08/31/23 1700  ceFAZolin (ANCEF) IVPB 2g/100 mL premix        2 g 200 mL/hr over 30 Minutes Intravenous Every 6 hours 08/31/23 1519 08/31/23 2238   08/31/23 0845  ceFAZolin (ANCEF) IVPB 2g/100 mL premix        2 g 200 mL/hr over 30 Minutes Intravenous On call to O.R. 08/31/23 0830 08/31/23 1121        Patient was given sequential compression devices, early ambulation, and chemoprophylaxis to prevent DVT.  Patient benefited maximally from hospital stay and there were no complications.    Recent vital signs: No data found.   Recent laboratory studies: No results for input(s): "WBC", "HGB", "HCT", "PLT", "NA", "K",  "CL", "CO2", "BUN", "CREATININE", "GLUCOSE", "INR", "CALCIUM" in the last 72 hours.  Invalid input(s): "PT", "2"   Discharge Medications:   Allergies as of 09/01/2023       Reactions   Shrimp [shellfish Allergy]    Headache         Medication List     STOP taking these medications    Vitamin D (Ergocalciferol) 1.25 MG (50000 UNIT) Caps capsule Commonly known as: DRISDOL       TAKE these medications    acetaminophen 500 MG tablet Commonly known as: TYLENOL Take 500 mg by mouth every 6 (six) hours as needed for moderate pain.   apixaban 2.5 MG Tabs tablet Commonly known as: ELIQUIS Take 1 tablet (2.5 mg total) by mouth every 12 (twelve) hours for 20 days. Then take one 81 mg aspirin once a day for three weeks. Then discontinue aspirin.   methocarbamol 500 MG tablet Commonly known as: ROBAXIN Take 1 tablet (500 mg total) by mouth every 6 (six) hours as needed for muscle spasms.   mupirocin ointment 2 % Commonly known as: BACTROBAN Apply 1 Application topically daily.   ondansetron 4 MG tablet Commonly known as: ZOFRAN Take 1 tablet (4 mg total) by mouth every 6 (six) hours as needed for nausea.   oxyCODONE 5 MG immediate release tablet Commonly known as: Oxy IR/ROXICODONE Take 1-2 tablets (  5-10 mg total) by mouth every 6 (six) hours as needed for severe pain.   traMADol 50 MG tablet Commonly known as: ULTRAM Take 1-2 tablets (50-100 mg total) by mouth every 6 (six) hours as needed for moderate pain.               Discharge Care Instructions  (From admission, onward)           Start     Ordered   09/01/23 0000  Weight bearing as tolerated        09/01/23 0839   09/01/23 0000  Change dressing       Comments: You may remove the bulky bandage (ACE wrap and gauze) two days after surgery. You will have an adhesive waterproof bandage underneath. Leave this in place until your first follow-up appointment.   09/01/23 0839            Diagnostic  Studies: No results found.  Disposition: Discharge disposition: 01-Home or Self Care       Discharge Instructions     Call MD / Call 911   Complete by: As directed    If you experience chest pain or shortness of breath, CALL 911 and be transported to the hospital emergency room.  If you develope a fever above 101 F, pus (white drainage) or increased drainage or redness at the wound, or calf pain, call your surgeon's office.   Change dressing   Complete by: As directed    You may remove the bulky bandage (ACE wrap and gauze) two days after surgery. You will have an adhesive waterproof bandage underneath. Leave this in place until your first follow-up appointment.   Constipation Prevention   Complete by: As directed    Drink plenty of fluids.  Prune juice may be helpful.  You may use a stool softener, such as Colace (over the counter) 100 mg twice a day.  Use MiraLax (over the counter) for constipation as needed.   Diet - low sodium heart healthy   Complete by: As directed    Do not put a pillow under the knee. Place it under the heel.   Complete by: As directed    Driving restrictions   Complete by: As directed    No driving for two weeks   Post-operative opioid taper instructions:   Complete by: As directed    POST-OPERATIVE OPIOID TAPER INSTRUCTIONS: It is important to wean off of your opioid medication as soon as possible. If you do not need pain medication after your surgery it is ok to stop day one. Opioids include: Codeine, Hydrocodone(Norco, Vicodin), Oxycodone(Percocet, oxycontin) and hydromorphone amongst others.  Long term and even short term use of opiods can cause: Increased pain response Dependence Constipation Depression Respiratory depression And more.  Withdrawal symptoms can include Flu like symptoms Nausea, vomiting And more Techniques to manage these symptoms Hydrate well Eat regular healthy meals Stay active Use relaxation techniques(deep breathing,  meditating, yoga) Do Not substitute Alcohol to help with tapering If you have been on opioids for less than two weeks and do not have pain than it is ok to stop all together.  Plan to wean off of opioids This plan should start within one week post op of your joint replacement. Maintain the same interval or time between taking each dose and first decrease the dose.  Cut the total daily intake of opioids by one tablet each day Next start to increase the time between doses. The last dose that should  be eliminated is the evening dose.      TED hose   Complete by: As directed    Use stockings (TED hose) for three weeks on both leg(s).  You may remove them at night for sleeping.   Weight bearing as tolerated   Complete by: As directed         Follow-up Information     Anouk Critzer L, PA. Go on 09/14/2023.   Specialty: Orthopedic Surgery Why: You are scheduled for a follow up appointment on 09-14-23 at 10:45 am. Contact information: [address] [place] [address] [phone]                  Signed: Arther Abbott 09/08/2023, 8:42 AM

## 2023-09-10 DIAGNOSIS — M25561 Pain in right knee: Secondary | ICD-10-CM | POA: Diagnosis not present

## 2023-09-14 DIAGNOSIS — M25561 Pain in right knee: Secondary | ICD-10-CM | POA: Diagnosis not present

## 2023-09-16 DIAGNOSIS — M25561 Pain in right knee: Secondary | ICD-10-CM | POA: Diagnosis not present

## 2023-09-21 DIAGNOSIS — M25561 Pain in right knee: Secondary | ICD-10-CM | POA: Diagnosis not present

## 2023-09-23 DIAGNOSIS — M25561 Pain in right knee: Secondary | ICD-10-CM | POA: Diagnosis not present

## 2023-09-28 DIAGNOSIS — M25561 Pain in right knee: Secondary | ICD-10-CM | POA: Diagnosis not present

## 2023-09-30 DIAGNOSIS — M25561 Pain in right knee: Secondary | ICD-10-CM | POA: Diagnosis not present

## 2023-10-05 DIAGNOSIS — M25561 Pain in right knee: Secondary | ICD-10-CM | POA: Diagnosis not present

## 2023-10-06 DIAGNOSIS — Z5189 Encounter for other specified aftercare: Secondary | ICD-10-CM | POA: Diagnosis not present

## 2023-10-07 DIAGNOSIS — M25561 Pain in right knee: Secondary | ICD-10-CM | POA: Diagnosis not present

## 2023-10-12 DIAGNOSIS — Z23 Encounter for immunization: Secondary | ICD-10-CM | POA: Diagnosis not present

## 2023-10-13 DIAGNOSIS — M25561 Pain in right knee: Secondary | ICD-10-CM | POA: Diagnosis not present

## 2023-10-16 DIAGNOSIS — M25561 Pain in right knee: Secondary | ICD-10-CM | POA: Diagnosis not present

## 2023-10-19 DIAGNOSIS — M25561 Pain in right knee: Secondary | ICD-10-CM | POA: Diagnosis not present

## 2023-10-20 DIAGNOSIS — Z471 Aftercare following joint replacement surgery: Secondary | ICD-10-CM | POA: Diagnosis not present

## 2023-10-20 DIAGNOSIS — Z96651 Presence of right artificial knee joint: Secondary | ICD-10-CM | POA: Diagnosis not present

## 2023-10-21 DIAGNOSIS — M25561 Pain in right knee: Secondary | ICD-10-CM | POA: Diagnosis not present

## 2023-10-26 DIAGNOSIS — M25561 Pain in right knee: Secondary | ICD-10-CM | POA: Diagnosis not present

## 2023-10-30 DIAGNOSIS — M25561 Pain in right knee: Secondary | ICD-10-CM | POA: Diagnosis not present

## 2023-11-03 DIAGNOSIS — M25561 Pain in right knee: Secondary | ICD-10-CM | POA: Diagnosis not present

## 2024-01-04 ENCOUNTER — Ambulatory Visit: Payer: BLUE CROSS/BLUE SHIELD | Admitting: Podiatry

## 2024-01-06 DIAGNOSIS — D485 Neoplasm of uncertain behavior of skin: Secondary | ICD-10-CM | POA: Diagnosis not present

## 2024-01-06 DIAGNOSIS — C44722 Squamous cell carcinoma of skin of right lower limb, including hip: Secondary | ICD-10-CM | POA: Diagnosis not present

## 2024-01-06 DIAGNOSIS — L821 Other seborrheic keratosis: Secondary | ICD-10-CM | POA: Diagnosis not present

## 2024-01-06 DIAGNOSIS — Z85828 Personal history of other malignant neoplasm of skin: Secondary | ICD-10-CM | POA: Diagnosis not present

## 2024-01-06 DIAGNOSIS — D045 Carcinoma in situ of skin of trunk: Secondary | ICD-10-CM | POA: Diagnosis not present

## 2024-01-06 DIAGNOSIS — L720 Epidermal cyst: Secondary | ICD-10-CM | POA: Diagnosis not present

## 2024-01-06 DIAGNOSIS — C44729 Squamous cell carcinoma of skin of left lower limb, including hip: Secondary | ICD-10-CM | POA: Diagnosis not present

## 2024-01-06 DIAGNOSIS — D1801 Hemangioma of skin and subcutaneous tissue: Secondary | ICD-10-CM | POA: Diagnosis not present

## 2024-03-16 DIAGNOSIS — Z85828 Personal history of other malignant neoplasm of skin: Secondary | ICD-10-CM | POA: Diagnosis not present

## 2024-03-16 DIAGNOSIS — L910 Hypertrophic scar: Secondary | ICD-10-CM | POA: Diagnosis not present

## 2024-04-18 DIAGNOSIS — R5383 Other fatigue: Secondary | ICD-10-CM | POA: Diagnosis not present

## 2024-04-18 DIAGNOSIS — E785 Hyperlipidemia, unspecified: Secondary | ICD-10-CM | POA: Diagnosis not present

## 2024-04-18 DIAGNOSIS — E559 Vitamin D deficiency, unspecified: Secondary | ICD-10-CM | POA: Diagnosis not present

## 2024-04-18 DIAGNOSIS — F418 Other specified anxiety disorders: Secondary | ICD-10-CM | POA: Diagnosis not present

## 2024-04-25 DIAGNOSIS — Z86718 Personal history of other venous thrombosis and embolism: Secondary | ICD-10-CM | POA: Diagnosis not present

## 2024-04-25 DIAGNOSIS — I700 Atherosclerosis of aorta: Secondary | ICD-10-CM | POA: Diagnosis not present

## 2024-04-25 DIAGNOSIS — E785 Hyperlipidemia, unspecified: Secondary | ICD-10-CM | POA: Diagnosis not present

## 2024-04-25 DIAGNOSIS — Z1339 Encounter for screening examination for other mental health and behavioral disorders: Secondary | ICD-10-CM | POA: Diagnosis not present

## 2024-04-25 DIAGNOSIS — F418 Other specified anxiety disorders: Secondary | ICD-10-CM | POA: Diagnosis not present

## 2024-04-25 DIAGNOSIS — R82998 Other abnormal findings in urine: Secondary | ICD-10-CM | POA: Diagnosis not present

## 2024-04-25 DIAGNOSIS — M8580 Other specified disorders of bone density and structure, unspecified site: Secondary | ICD-10-CM | POA: Diagnosis not present

## 2024-04-25 DIAGNOSIS — Z0000 Encounter for general adult medical examination without abnormal findings: Secondary | ICD-10-CM | POA: Diagnosis not present

## 2024-04-25 DIAGNOSIS — R030 Elevated blood-pressure reading, without diagnosis of hypertension: Secondary | ICD-10-CM | POA: Diagnosis not present

## 2024-04-25 DIAGNOSIS — Z85820 Personal history of malignant melanoma of skin: Secondary | ICD-10-CM | POA: Diagnosis not present

## 2024-04-25 DIAGNOSIS — M25561 Pain in right knee: Secondary | ICD-10-CM | POA: Diagnosis not present

## 2024-04-25 DIAGNOSIS — Z1331 Encounter for screening for depression: Secondary | ICD-10-CM | POA: Diagnosis not present

## 2024-04-25 DIAGNOSIS — E559 Vitamin D deficiency, unspecified: Secondary | ICD-10-CM | POA: Diagnosis not present

## 2024-05-04 DIAGNOSIS — Z85828 Personal history of other malignant neoplasm of skin: Secondary | ICD-10-CM | POA: Diagnosis not present

## 2024-05-04 DIAGNOSIS — L820 Inflamed seborrheic keratosis: Secondary | ICD-10-CM | POA: Diagnosis not present

## 2024-05-04 DIAGNOSIS — L905 Scar conditions and fibrosis of skin: Secondary | ICD-10-CM | POA: Diagnosis not present

## 2024-05-06 DIAGNOSIS — Z1231 Encounter for screening mammogram for malignant neoplasm of breast: Secondary | ICD-10-CM | POA: Diagnosis not present

## 2024-09-05 DIAGNOSIS — L820 Inflamed seborrheic keratosis: Secondary | ICD-10-CM | POA: Diagnosis not present

## 2024-09-05 DIAGNOSIS — Z85828 Personal history of other malignant neoplasm of skin: Secondary | ICD-10-CM | POA: Diagnosis not present

## 2024-09-05 DIAGNOSIS — L570 Actinic keratosis: Secondary | ICD-10-CM | POA: Diagnosis not present

## 2024-09-05 DIAGNOSIS — L814 Other melanin hyperpigmentation: Secondary | ICD-10-CM | POA: Diagnosis not present

## 2024-09-05 DIAGNOSIS — L905 Scar conditions and fibrosis of skin: Secondary | ICD-10-CM | POA: Diagnosis not present

## 2024-09-05 DIAGNOSIS — L821 Other seborrheic keratosis: Secondary | ICD-10-CM | POA: Diagnosis not present

## 2024-09-05 DIAGNOSIS — D1801 Hemangioma of skin and subcutaneous tissue: Secondary | ICD-10-CM | POA: Diagnosis not present

## 2024-09-05 DIAGNOSIS — D225 Melanocytic nevi of trunk: Secondary | ICD-10-CM | POA: Diagnosis not present

## 2024-09-05 DIAGNOSIS — C44722 Squamous cell carcinoma of skin of right lower limb, including hip: Secondary | ICD-10-CM | POA: Diagnosis not present

## 2024-09-13 DIAGNOSIS — C44722 Squamous cell carcinoma of skin of right lower limb, including hip: Secondary | ICD-10-CM | POA: Diagnosis not present

## 2024-09-13 DIAGNOSIS — Z85828 Personal history of other malignant neoplasm of skin: Secondary | ICD-10-CM | POA: Diagnosis not present

## 2024-09-13 DIAGNOSIS — L570 Actinic keratosis: Secondary | ICD-10-CM | POA: Diagnosis not present

## 2024-10-12 DIAGNOSIS — C44722 Squamous cell carcinoma of skin of right lower limb, including hip: Secondary | ICD-10-CM | POA: Diagnosis not present

## 2024-10-12 DIAGNOSIS — Z85828 Personal history of other malignant neoplasm of skin: Secondary | ICD-10-CM | POA: Diagnosis not present

## 2024-11-03 DIAGNOSIS — Z23 Encounter for immunization: Secondary | ICD-10-CM | POA: Diagnosis not present
